# Patient Record
Sex: Female | Born: 1950 | Hispanic: No | Smoking: Never smoker
Health system: Southern US, Community
[De-identification: ages and names within clinical notes are randomized; demographics above are authoritative.]

## PROBLEM LIST (undated history)

## (undated) DIAGNOSIS — G8929 Other chronic pain: Secondary | ICD-10-CM

## (undated) DIAGNOSIS — R51 Headache: Secondary | ICD-10-CM

## (undated) DIAGNOSIS — M25569 Pain in unspecified knee: Secondary | ICD-10-CM

## (undated) DIAGNOSIS — R0602 Shortness of breath: Secondary | ICD-10-CM

## (undated) DIAGNOSIS — R112 Nausea with vomiting, unspecified: Secondary | ICD-10-CM

## (undated) DIAGNOSIS — E039 Hypothyroidism, unspecified: Secondary | ICD-10-CM

## (undated) DIAGNOSIS — Z9889 Other specified postprocedural states: Secondary | ICD-10-CM

## (undated) DIAGNOSIS — M5416 Radiculopathy, lumbar region: Secondary | ICD-10-CM

## (undated) DIAGNOSIS — I10 Essential (primary) hypertension: Secondary | ICD-10-CM

## (undated) DIAGNOSIS — K5900 Constipation, unspecified: Secondary | ICD-10-CM

## (undated) DIAGNOSIS — E7800 Pure hypercholesterolemia, unspecified: Secondary | ICD-10-CM

## (undated) DIAGNOSIS — E119 Type 2 diabetes mellitus without complications: Secondary | ICD-10-CM

## (undated) DIAGNOSIS — M1990 Unspecified osteoarthritis, unspecified site: Secondary | ICD-10-CM

## (undated) HISTORY — PX: APPENDECTOMY: SHX54

---

## 2012-09-28 ENCOUNTER — Encounter (HOSPITAL_COMMUNITY): Payer: Self-pay | Admitting: Emergency Medicine

## 2012-09-28 ENCOUNTER — Emergency Department (INDEPENDENT_AMBULATORY_CARE_PROVIDER_SITE_OTHER)
Admission: EM | Admit: 2012-09-28 | Discharge: 2012-09-28 | Disposition: A | Discharge status: Home / Self Care | Payer: Managed Care, Other (non HMO) | Source: Home / Self Care | Attending: Family Medicine | Admitting: Family Medicine

## 2012-09-28 DIAGNOSIS — I10 Essential (primary) hypertension: Secondary | ICD-10-CM

## 2012-09-28 DIAGNOSIS — E119 Type 2 diabetes mellitus without complications: Secondary | ICD-10-CM

## 2012-09-28 HISTORY — DX: Other chronic pain: G89.29

## 2012-09-28 HISTORY — DX: Pain in unspecified knee: M25.569

## 2012-09-28 HISTORY — DX: Pure hypercholesterolemia, unspecified: E78.00

## 2012-09-28 HISTORY — DX: Essential (primary) hypertension: I10

## 2012-09-28 MED ORDER — LEVOTHYROXINE SODIUM 100 MCG PO TABS: 100.0000 ug | ORAL_TABLET | Freq: Every day | ORAL | Status: DC

## 2012-09-28 MED ORDER — IBUPROFEN 600 MG PO TABS: 600.0000 mg | ORAL_TABLET | Freq: Four times a day (QID) | ORAL | Status: DC | PRN

## 2012-09-28 MED ORDER — INSULIN GLARGINE 100 UNIT/ML SOLOSTAR PEN: PEN_INJECTOR | SUBCUTANEOUS | Status: DC

## 2012-09-28 MED ORDER — SIMVASTATIN 40 MG PO TABS: 40.0000 mg | ORAL_TABLET | Freq: Every evening | ORAL | Status: DC

## 2012-09-28 MED ORDER — LISINOPRIL-HYDROCHLOROTHIAZIDE 20-12.5 MG PO TABS: 1.0000 | ORAL_TABLET | Freq: Every day | ORAL | Status: DC

## 2012-09-28 MED ORDER — GLIMEPIRIDE 2 MG PO TABS: 2.0000 mg | ORAL_TABLET | Freq: Every day | ORAL | Status: DC

## 2012-09-28 MED ORDER — METFORMIN HCL 500 MG PO TABS: 500.0000 mg | ORAL_TABLET | Freq: Two times a day (BID) | ORAL | Status: DC

## 2012-09-28 NOTE — ED Notes (Signed)
Patient has arrived in [place] 2 weeks prior.  Patient here today for medication check and to establish with provider.  Patient is from [place], speaks arabic.  Two adult sons in treatment room.

## 2012-09-28 NOTE — ED Provider Notes (Signed)
CSN: [id number]     Arrival date & time 09/28/12  1645 History   First MD Initiated Contact with Patient 09/28/12 1743     No chief complaint on file.  (Consider location/radiation/quality/duration/timing/severity/associated sxs/prior Treatment) HPI Patient is a 62 yo F who moved to the [place] 2 weeks ago from [place]. She is brought in by her [facility] to establish care and go through her medications. She reports she has DM, HTN and problems with thyroid. She takes medications from [place] currently but does not have refills or large supply of medication. She also reports lower back pain and constipation. She hopes to check on her medication today and to get new prescriptions. She would like extensive blood work today, but explained to patient that she will need a PCP.  Lantus 30 units, Glucophage XR 750mg , Glyburide 5mg , Levothryroxine 300mg , Simvastatin 20, Voltaren 50mg .  [employer]pre[employer] [phone] was used for entire visit.  Past Medical History  Diagnosis Date  . Hypertension   . Diabetes mellitus without complication   . High cholesterol   . Thyroid disease   . Chronic knee pain    Past Surgical History  Procedure Laterality Date  . Abdominal surgery     No family history on file. History  Substance Use Topics  . Smoking status: Never Smoker   . Smokeless tobacco: Not on file  . Alcohol Use: No   OB History   Grav Para Term Preterm Abortions TAB SAB Ect Mult Living                 Review of Systems  Constitutional: Negative for fever and chills.  HENT: Negative for congestion.   Eyes: Negative for visual disturbance.  Respiratory: Negative for cough and shortness of breath.   Cardiovascular: Negative for chest pain and leg swelling.  Gastrointestinal: Positive for constipation. Negative for abdominal pain.  Genitourinary: Negative for dysuria.  Musculoskeletal: Positive for back pain. Negative for myalgias and arthralgias.   Skin: Negative for rash.  Neurological: Negative for headaches.    Allergies  Review of patient's allergies indicates no known allergies.  Home Medications   Current Outpatient Rx  Name  Route  Sig  Dispense  Refill  . aspirin 81 MG tablet   Oral   Take 81 mg by mouth daily.         Marland Kitchen glimepiride (AMARYL) 2 MG tablet   Oral   Take 1 tablet (2 mg total) by mouth daily before breakfast.   30 tablet   0   . ibuprofen (ADVIL,MOTRIN) 600 MG tablet   Oral   Take 1 tablet (600 mg total) by mouth every 6 (six) hours as needed for pain.   30 tablet   0   . Insulin Glargine (LANTUS SOLOSTAR) 100 UNIT/ML SOPN      Take 30 units   15 mL   0     3 ml per pen. 5 pens per box. Please dispense 1 bo ...   . levothyroxine (SYNTHROID, LEVOTHROID) 100 MCG tablet   Oral   Take 1 tablet (100 mcg total) by mouth daily before breakfast.   30 tablet   0   . lisinopril-hydrochlorothiazide (PRINZIDE,ZESTORETIC) 20-12.5 MG per tablet   Oral   Take 1 tablet by mouth daily.   30 tablet   0   . metFORMIN (GLUCOPHAGE) 500 MG tablet   Oral   Take 1 tablet (500 mg total) by mouth 2 (two)  times daily with a meal.   60 tablet   0   . simvastatin (ZOCOR) 40 MG tablet   Oral   Take 1 tablet (40 mg total) by mouth every evening.   30 tablet   0    BP 151/78  Pulse 106  Temp(Src) 98.6 F (37 C) (Oral)  Resp 18  SpO2 98% Physical Exam  Constitutional: She is oriented to person, place, and time. She appears well-developed and well-nourished. No distress.  HENT:  Head: Normocephalic and atraumatic.  Eyes: Pupils are equal, round, and reactive to light.  Neck: Normal range of motion.  Cardiovascular: Normal rate, regular rhythm and normal heart sounds.   Pulmonary/Chest: Effort normal and breath sounds normal. She has no wheezes.  Abdominal: Soft. There is no tenderness.  Musculoskeletal: Normal range of motion. She exhibits no edema and no tenderness.  Lymphadenopathy:    She  has no cervical adenopathy.  Neurological: She is alert and oriented to person, place, and time.  Skin: Skin is warm and dry. No rash noted.    ED Course  Procedures (including critical care time) Labs Review Labs Reviewed - No data to display Imaging Review No results found.  MDM   1. Diabetes   2. HTN (hypertension)    62 yo F presenting to establish care in the [place] and get medications. I have explained to patient and her son that this is an urgent care center and she will need to be seen by a primary care doctor for labs and further adjustments of her medications. However, since she is nearly out of her medications, I will prescribe the [place] equivalent of all of her medications until she can find a doctor. - All medications verified and given Rx for 30 day supply of all the above medications - Given list of local resources, including [facility] and [facility] - If pain worsens or if her health changes prior to establishing care, she should return to [facility] to be evaluated.   Hilarie Fredrickson, MD 09/28/12 2034

## 2012-09-29 NOTE — ED Provider Notes (Signed)
Medical screening examination/treatment/procedure(s) were performed by a resident physician or non-physician practitioner and as the supervising physician I was immediately available for consultation/collaboration.  Clementeen Graham, MD   Rodolph Bong, MD 09/29/12 [phone]

## 2012-10-25 ENCOUNTER — Ambulatory Visit (INDEPENDENT_AMBULATORY_CARE_PROVIDER_SITE_OTHER): Payer: Managed Care, Other (non HMO) | Admitting: Family Medicine

## 2012-10-25 VITALS — BP 120/62 | HR 86 | Temp 98.4°F | Resp 16 | Ht 58.5 in | Wt 207.2 lb

## 2012-10-25 DIAGNOSIS — E1165 Type 2 diabetes mellitus with hyperglycemia: Secondary | ICD-10-CM

## 2012-10-25 DIAGNOSIS — E039 Hypothyroidism, unspecified: Secondary | ICD-10-CM

## 2012-10-25 DIAGNOSIS — E785 Hyperlipidemia, unspecified: Secondary | ICD-10-CM

## 2012-10-25 DIAGNOSIS — R350 Frequency of micturition: Secondary | ICD-10-CM

## 2012-10-25 DIAGNOSIS — E119 Type 2 diabetes mellitus without complications: Secondary | ICD-10-CM

## 2012-10-25 DIAGNOSIS — M545 Low back pain: Secondary | ICD-10-CM

## 2012-10-25 DIAGNOSIS — I10 Essential (primary) hypertension: Secondary | ICD-10-CM

## 2012-10-25 LAB — POCT URINALYSIS DIPSTICK
Bilirubin, UA: NEGATIVE
Blood, UA: NEGATIVE
Glucose, UA: NEGATIVE
Ketones, UA: NEGATIVE
Leukocytes, UA: NEGATIVE
Nitrite, UA: NEGATIVE
Protein, UA: NEGATIVE
Spec Grav, UA: 1.015
Urobilinogen, UA: 0.2
pH, UA: 6

## 2012-10-25 LAB — COMPREHENSIVE METABOLIC PANEL
ALT: 20 U/L (ref 0–35)
AST: 15 U/L (ref 0–37)
Alkaline Phosphatase: 73 U/L (ref 39–117)
CO2: 29 mEq/L (ref 19–32)
Sodium: 139 mEq/L (ref 135–145)
Total Bilirubin: 0.3 mg/dL (ref 0.3–1.2)
Total Protein: 7 g/dL (ref 6.0–8.3)

## 2012-10-25 LAB — POCT UA - MICROSCOPIC ONLY
Bacteria, U Microscopic: NEGATIVE
Casts, Ur, LPF, POC: NEGATIVE
Crystals, Ur, HPF, POC: NEGATIVE
Mucus, UA: NEGATIVE
Yeast, UA: NEGATIVE

## 2012-10-25 LAB — LIPID PANEL
LDL Cholesterol: 80 mg/dL (ref 0–99)
VLDL: 31 mg/dL (ref 0–40)

## 2012-10-25 LAB — POCT GLYCOSYLATED HEMOGLOBIN (HGB A1C): Hemoglobin A1C: 9.3

## 2012-10-25 MED ORDER — INSULIN GLARGINE 100 UNIT/ML SOLOSTAR PEN: PEN_INJECTOR | SUBCUTANEOUS | Status: DC

## 2012-10-25 MED ORDER — LEVOTHYROXINE SODIUM 100 MCG PO TABS: 100.0000 ug | ORAL_TABLET | Freq: Every day | ORAL | Status: DC

## 2012-10-25 MED ORDER — SIMVASTATIN 40 MG PO TABS: 40.0000 mg | ORAL_TABLET | Freq: Every evening | ORAL | Status: DC

## 2012-10-25 MED ORDER — LISINOPRIL-HYDROCHLOROTHIAZIDE 20-12.5 MG PO TABS: 1.0000 | ORAL_TABLET | Freq: Every day | ORAL | Status: DC

## 2012-10-25 MED ORDER — METFORMIN HCL 500 MG PO TABS: 500.0000 mg | ORAL_TABLET | Freq: Two times a day (BID) | ORAL | Status: DC

## 2012-10-25 NOTE — Progress Notes (Addendum)
Subjective:  This chart was scribed for Shade Flood, MD by Leone Payor, ED Scribe. This patient was seen in room Room 8 and the patient's care was started 3:06 PM.   Patient ID: Danielle Ferguson, female    DOB: Jul 01, 1950, 62 y.o.   MRN: [id number]  HPI  Language barrier, son is translating.  HPI Comments: Danielle Ferguson is a 62 y.o. female who presents to [facility] requesting medication refill for DM, HTN, hypothyroidism today. She was last seen at [facility] on 09/28/12 to establish care and medication prescription for DM and HTN. Instructed then for establishing PCP locally, she was given local numbers for PCP along with 30 days medication refill for DM, HTN, and hypothyroidism. No labs noted, just medicines were refilled at that time. Son reports attempting to establish care today but pt scheduled to be established with [facility] family practice for 2-4 weeks. Pt states she has been seen by ortho for her chronic knee pain, but not back pain.   Pt also complains of 1-2 months of lower abdominal pain that wraps around from the low back. She has associated urinary frequency since being diagnosed with DM, but no recent changes in urinary symptoms. She denies known fever, dysuria, chest pain, or shortness of breath. No n/v. At end of visit, asked about constipation treatment   Ongoing, constant, unchanged back pain that radiates to the right leg that has been ongoing for 4 months. She denies weakness to the right lower extremity. She denies loss of bowel or bladder function, anogenital numbness, BLE numbness or weakness. Takes diclofenac BID. Initially stated had not had xray of back or eval, but later in exam/hx, stated that another doctor treated her for her back a month ago - had xray of back, but no specific treatment? Just treated with diclofenac for her knees.   DM2 - on lantus 30 units qd, amaryl 2mg  qd, metformin 500mg  qd.  Home blood sugars 210 -300, 400 and as low as 90 a week ago, but no  symptomatic lows. Denies missed meds.   Difficult history- son translating, but questions repeated multiple times for understanding. Understanding expressed after history and at end of visit after all questions answered.    Past Medical History  Diagnosis Date  . Hypertension   . Diabetes mellitus without complication   . High cholesterol   . Thyroid disease   . Chronic knee pain     Past Surgical History  Procedure Laterality Date  . Abdominal surgery      No Known Allergies  Prior to Admission medications   Medication Sig Start Date End Date Taking? Authorizing Provider  aspirin 81 MG tablet Take 81 mg by mouth daily.   Yes Historical Provider, MD  diclofenac (VOLTAREN) 75 MG EC tablet Take 75 mg by mouth 2 (two) times daily.   Yes Historical Provider, MD  docusate sodium (COLACE) 100 MG capsule Take 100 mg by mouth 2 (two) times daily.   Yes Historical Provider, MD  glimepiride (AMARYL) 2 MG tablet Take 1 tablet (2 mg total) by mouth daily before breakfast. 09/28/12  Yes Amber Nydia Bouton, MD  Insulin Glargine (LANTUS SOLOSTAR) 100 UNIT/ML SOPN Take 30 units 09/28/12  Yes Amber Nydia Bouton, MD  levothyroxine (SYNTHROID, LEVOTHROID) 100 MCG tablet Take 1 tablet (100 mcg total) by mouth daily before breakfast. 09/28/12  Yes Amber Nydia Bouton, MD  lisinopril-hydrochlorothiazide (PRINZIDE,ZESTORETIC) 20-12.5 MG per tablet Take 1 tablet by mouth daily. 09/28/12  Yes Amber M Hairford,  MD  metFORMIN (GLUCOPHAGE) 500 MG tablet Take 1 tablet (500 mg total) by mouth 2 (two) times daily with a meal. 09/28/12  Yes Amber Nydia Bouton, MD  simvastatin (ZOCOR) 40 MG tablet Take 1 tablet (40 mg total) by mouth every evening. 09/28/12  Yes Amber Nydia Bouton, MD  ibuprofen (ADVIL,MOTRIN) 600 MG tablet Take 1 tablet (600 mg total) by mouth every 6 (six) hours as needed for pain. 09/28/12   Amber Nydia Bouton, MD    History   Social History  . Marital Status: Widowed    Spouse Name: N/A    Number of  Children: N/A  . Years of Education: N/A   Occupational History  . Not on file.   Social History Main Topics  . Smoking status: Never Smoker   . Smokeless tobacco: Not on file  . Alcohol Use: No  . Drug Use: No  . Sexual Activity: Not on file   Other Topics Concern  . Not on file   Social History Narrative  . No narrative on file     Review of Systems  Constitutional: Negative for fever.  Respiratory: Negative for shortness of breath.   Cardiovascular: Negative for chest pain.  Gastrointestinal: Positive for abdominal pain (lower).  Genitourinary: Negative for dysuria.  Musculoskeletal: Positive for back pain.  Neurological: Negative for weakness and numbness.       Objective:   Physical Exam  Nursing note and vitals reviewed. Constitutional: She is oriented to person, place, and time. She appears well-developed and well-nourished.  HENT:  Head: Normocephalic and atraumatic.  Eyes: Conjunctivae and EOM are normal. Pupils are equal, round, and reactive to light.  Neck: Normal range of motion. Neck supple. Carotid bruit is not present.  Cardiovascular: Normal rate, regular rhythm, normal heart sounds and intact distal pulses.   Pulmonary/Chest: Effort normal and breath sounds normal.  Abdominal: Soft. She exhibits no distension and no pulsatile midline mass. There is tenderness (slight suprapubic and RLQ/llq. ttp. ). There is no rebound and no guarding.  Musculoskeletal:       Lumbar back: She exhibits normal range of motion, no bony tenderness, no deformity and no spasm.  Slight discomfort to right knee with straight leg raise but not in the back.   No apparent weakness with ambulation, but did not complete heel/toe walk.   Neurological: She is alert and oriented to person, place, and time. She has normal strength. No sensory deficit.  Reflex Scores:      Patellar reflexes are 1+ on the right side and 1+ on the left side.      Achilles reflexes are 1+ on the right  side and 1+ on the left side. Microfilament testing wnl.   Skin: Skin is warm and dry.  Psychiatric: She has a normal mood and affect. Her behavior is normal.   Results for orders placed in visit on 10/25/12  POCT URINALYSIS DIPSTICK      Result Value Range   Color, UA yellow     Clarity, UA clear     Glucose, UA neg     Bilirubin, UA neg     Ketones, UA neg     Spec Grav, UA 1.015     Blood, UA neg     pH, UA 6.0     Protein, UA neg     Urobilinogen, UA 0.2     Nitrite, UA neg     Leukocytes, UA Negative    POCT UA - MICROSCOPIC ONLY  Result Value Range   WBC, Ur, HPF, POC 0-3     RBC, urine, microscopic 0-1     Bacteria, U Microscopic neg     Mucus, UA neg     Epithelial cells, urine per micros 3-6     Crystals, Ur, HPF, POC neg     Casts, Ur, LPF, POC neg     Yeast, UA neg    GLUCOSE, POCT (MANUAL RESULT ENTRY)      Result Value Range   POC Glucose 163 (*) 70 - 99 mg/dl  POCT GLYCOSYLATED HEMOGLOBIN (HGB A1C)      Result Value Range   Hemoglobin A1C 9.3         Assessment & Plan:     Frequent urination - Plan: POCT urinalysis dipstick, POCT UA - Microscopic Only  DM type 2 (diabetes mellitus, type 2) - Plan: metFORMIN (GLUCOPHAGE) 500 MG tablet, Insulin Glargine (LANTUS SOLOSTAR) 100 UNIT/ML SOPN, POCT glucose (manual entry), POCT glycosylated hemoglobin (Hb A1C). Uncontrolled, but reported low of 90, and up to 300-400. Check home cbg's next 1 week as below for more accurate picture of control. Denies missed doses. rtcer precautions.   HTN (hypertension) - Plan: lisinopril-hydrochlorothiazide (PRINZIDE,ZESTORETIC) 20-12.5 MG per tablet. Stable, continue same meds, CMP pending.   Unspecified hypothyroidism - Plan: levothyroxine (SYNTHROID, LEVOTHROID) 100 MCG tabl refilled. , TSH pending.   Low back pain - unsure if she has had XR and eval before as hx changed during ov, but advised to discuss further at follow up.  XR cancelled as possible XR done 1 month  ago.    Other and unspecified hyperlipidemia - Plan: simvastatin (ZOCOR) 40 MG tablet refilled, Comprehensive metabolic panel, Lipid panel pending. abd pain - radiation from back vs constipation as mentioned at end of visit.  incr fiber in diet, fluids and discuss further at follow up.   I personally performed the services described in this documentation, which was scribed in my presence. The recorded information has been reviewed and considered, and addended by me as needed.   Meds ordered this encounter  Medications  . diclofenac (VOLTAREN) 75 MG EC tablet    Sig: Take 75 mg by mouth 2 (two) times daily.  Marland Kitchen docusate sodium (COLACE) 100 MG capsule    Sig: Take 100 mg by mouth 2 (two) times daily.  . simvastatin (ZOCOR) 40 MG tablet    Sig: Take 1 tablet (40 mg total) by mouth every evening.    Dispense:  30 tablet    Refill:  0  . metFORMIN (GLUCOPHAGE) 500 MG tablet    Sig: Take 1 tablet (500 mg total) by mouth 2 (two) times daily with a meal.    Dispense:  60 tablet    Refill:  0  . lisinopril-hydrochlorothiazide (PRINZIDE,ZESTORETIC) 20-12.5 MG per tablet    Sig: Take 1 tablet by mouth daily.    Dispense:  30 tablet    Refill:  0  . levothyroxine (SYNTHROID, LEVOTHROID) 100 MCG tablet    Sig: Take 1 tablet (100 mcg total) by mouth daily before breakfast.    Dispense:  30 tablet    Refill:  0  . Insulin Glargine (LANTUS SOLOSTAR) 100 UNIT/ML SOPN    Sig: Take 30 units    Dispense:  15 mL    Refill:  0    3 ml per pen. 5 pens per box. Please dispense 1 box of pens.   Patient Instructions  Please let me know where she had the  xray of her back and we can talk more about this and her abdominal pain next office visit or this can be followed up at family practice if seen in the next month or two.   Keep a record of your blood sugars 3 times per day and return here or family practice center in next 1 week with these readings to discuss diabetes meds and control as it is  uncontrolled right now. Return to the clinic or go to the nearest emergency room if any of your symptoms worsen or new symptoms occur.  You should receive a call or letter about your lab results within the next week to 10 days.

## 2012-10-25 NOTE — Patient Instructions (Addendum)
Please let me know where she had the xray of her back and we can talk more about this and her abdominal pain next office visit or this can be followed up at family practice if seen in the next month or two.   Keep a record of your blood sugars 3 times per day and return here or family practice center in next 1 week with these readings to discuss diabetes meds and control as it is uncontrolled right now. Return to the clinic or go to the nearest emergency room if any of your symptoms worsen or new symptoms occur.  You should receive a call or letter about your lab results within the next week to 10 days.

## 2012-11-03 ENCOUNTER — Ambulatory Visit (INDEPENDENT_AMBULATORY_CARE_PROVIDER_SITE_OTHER): Payer: Managed Care, Other (non HMO) | Admitting: Family Medicine

## 2012-11-03 VITALS — BP 122/68 | HR 94 | Temp 97.9°F | Resp 18 | Ht <= 58 in | Wt 207.0 lb

## 2012-11-03 DIAGNOSIS — IMO0001 Reserved for inherently not codable concepts without codable children: Secondary | ICD-10-CM

## 2012-11-03 DIAGNOSIS — Z23 Encounter for immunization: Secondary | ICD-10-CM

## 2012-11-03 DIAGNOSIS — K5900 Constipation, unspecified: Secondary | ICD-10-CM

## 2012-11-03 DIAGNOSIS — R109 Unspecified abdominal pain: Secondary | ICD-10-CM

## 2012-11-03 DIAGNOSIS — E039 Hypothyroidism, unspecified: Secondary | ICD-10-CM

## 2012-11-03 DIAGNOSIS — E119 Type 2 diabetes mellitus without complications: Secondary | ICD-10-CM

## 2012-11-03 MED ORDER — METFORMIN HCL 1000 MG PO TABS: 1000.0000 mg | ORAL_TABLET | Freq: Two times a day (BID) | ORAL | Status: DC

## 2012-11-03 NOTE — Patient Instructions (Signed)
1.  INCREASE METFORMIN TO 1000MG  ONE TABLET TWICE DAILY. 2. RETURN IN ONE MONTH 3.  RECORD SUGARS THREE TIMES DAILY ONE WEEK BEFORE NEXT VISIT. 4. INCREASE THYROID MEDICATION TO ONE TABLET ON MONDAYS, WEDNESDAYS, FRIDAYS; TAKE 1/2 TABLET ALL OTHER DAYS OF THE WEEK. 5.  INCREASE INSULIN TO 34 UNITS DAILY.

## 2012-11-03 NOTE — Progress Notes (Signed)
This chart was scribed for Ethelda Chick, MD by Caryn Bee, Medical Scribe. This patient was seen in Room4 and the patient's care was started at 4:05 PM.  Subjective:    Patient ID: Danielle Ferguson, female    DOB: 27-Dec-1950, 62 y.o.   MRN: [id number]  HPI Pt's native language is Arabic. An interpretor was used. HPI Comments: Danielle Ferguson is a 62 y.o. Female with h/o DM who presents to [facility] complaining of lower abdominal pain, urinary incontinence, and constipation. Pt states that the urinary incontinence is bothersome and embarrassing and would like relief. She thinks that the lower abdominal pain may be linked to her constipation.   She gives herself 30 units of lantus once per day and takes 2 pills of metformin daily. Pt is interested in any laboratory studies that may lead to a diagnosis, and she is also interested in an ultrasound of her uterus. Pt has never had a colonoscopy. She requested more information on details of a colonoscopy. She is not interested in a colonoscopy at this time. The pt is more interested in controlling her DM and resolving the constipation. Pt has prescribed thyroid medications that she has been taking regularly; however, pt was suffering from mouth dryness so she decreased her dose to only half a pill. She denies nausea, vomiting, diarrhea. Pt has agreed to get a flu shot during today's visit. Pt moved to the [place] from [place] one month ago.   Review of Systems  Constitutional: Negative for fever, chills, diaphoresis and fatigue.  Gastrointestinal: Positive for abdominal pain and constipation. Negative for nausea, vomiting and diarrhea.  Endocrine: Negative for cold intolerance, heat intolerance, polydipsia, polyphagia and polyuria.  Genitourinary: Positive for urgency and frequency. Negative for dysuria, hematuria, decreased urine volume, vaginal discharge, genital sores, vaginal pain and pelvic pain.  Musculoskeletal: Positive for back  pain.  Neurological: Negative for dizziness, tremors, weakness, light-headedness, numbness and headaches.   Past Medical History  Diagnosis Date  . Hypertension   . Diabetes mellitus without complication   . High cholesterol   . Thyroid disease   . Chronic knee pain    History   Social History  . Marital Status: Widowed    Spouse Name: N/A    Number of Children: N/A  . Years of Education: N/A   Occupational History  . Not on file.   Social History Main Topics  . Smoking status: Never Smoker   . Smokeless tobacco: Not on file  . Alcohol Use: No  . Drug Use: No  . Sexual Activity: Not on file   Other Topics Concern  . Not on file   Social History Narrative  . No narrative on file   Past Surgical History  Procedure Laterality Date  . Abdominal surgery     History reviewed. No pertinent family history. No Known Allergies  History   Social History  . Marital Status: Widowed    Spouse Name: N/A    Number of Children: N/A  . Years of Education: N/A   Occupational History  . Not on file.   Social History Main Topics  . Smoking status: Never Smoker   . Smokeless tobacco: Never Used  . Alcohol Use: No  . Drug Use: No  . Sexual Activity: Not on file   Other Topics Concern  . Not on file   Social History Narrative  . No narrative on file       Objective:   Physical Exam  Nursing note  and vitals reviewed. Constitutional: She is oriented to person, place, and time. She appears well-developed and well-nourished. No distress.  Eyes: Conjunctivae are normal. Pupils are equal, round, and reactive to light.  Neck: Normal range of motion. Neck supple.  Cardiovascular: Normal rate, regular rhythm, normal heart sounds and intact distal pulses.  Exam reveals no gallop and no friction rub.   No murmur heard. Pulmonary/Chest: Effort normal and breath sounds normal. No respiratory distress. She has no wheezes. She has no rales. She exhibits no tenderness.   Abdominal: Soft. Bowel sounds are normal. She exhibits no distension and no mass. There is tenderness. There is no rebound and no guarding.  Tenderness below umbilicus. Tender to palpation below umbilicus.Tenderness to palpation suprapubic region.  Musculoskeletal: Normal range of motion.  Neurological: She is alert and oriented to person, place, and time.  Skin: Skin is warm and dry. She is not diaphoretic.  Skin normal on feet bilaterally. Slight callus on bilateral feet.   Psychiatric: She has a normal mood and affect.       Assessment & Plan:  The primary encounter diagnosis was Type II or unspecified type diabetes mellitus without mention of complication, uncontrolled. Diagnoses of Unspecified hypothyroidism, Unspecified constipation, Abdominal  pain, other specified site, DM type 2 (diabetes mellitus, type 2), Need for prophylactic vaccination and inoculation against influenza, and Needs flu shot were also pertinent to this visit.  1.  DMII: Uncontrolled with HgbA1c of 9.8.  Rx for Metformin 1000mg  bid provided.  Encourage exercise, weight loss, low-sugar and low-carbohydrate food choices. 2.  Hypothyroidism:  Moderately controlled with TSH of 4.5 on 10/25/12.  Repeat in six months.   3.  Constipation: Uncontrolled; recommend Colace bid or Miralax daily; encourage increased water intake and fiber intake.  Warrants colonoscopy but pt declined referral for colonoscopy today. 4.  Abdominal pain lower:  New.  Likely secondary to constipation; treat constipation and reevaluate abdominal pain.  Benign abdominal exam today. 5.  S/p flu vaccine.   Meds ordered this encounter  Medications  . metFORMIN (GLUCOPHAGE) 1000 MG tablet    Sig: Take 1 tablet (1,000 mg total) by mouth 2 (two) times daily with a meal.    Dispense:  60 tablet    Refill:  2   I personally performed the services described in this documentation, which was scribed in my presence.  The recorded information has been reviewed  and is accurate.  Nilda Simmer, M.D.  [facility] [address] [place], [place]  [address] [phone] phone [phone] fax

## 2012-11-27 ENCOUNTER — Other Ambulatory Visit: Payer: Self-pay | Admitting: Family Medicine

## 2012-12-11 ENCOUNTER — Ambulatory Visit (INDEPENDENT_AMBULATORY_CARE_PROVIDER_SITE_OTHER): Payer: Managed Care, Other (non HMO) | Admitting: Family Medicine

## 2012-12-11 VITALS — BP 144/76 | HR 114 | Temp 98.6°F | Resp 18 | Ht 59.0 in | Wt 208.0 lb

## 2012-12-11 DIAGNOSIS — E785 Hyperlipidemia, unspecified: Secondary | ICD-10-CM

## 2012-12-11 DIAGNOSIS — E119 Type 2 diabetes mellitus without complications: Secondary | ICD-10-CM

## 2012-12-11 DIAGNOSIS — M1710 Unilateral primary osteoarthritis, unspecified knee: Secondary | ICD-10-CM

## 2012-12-11 DIAGNOSIS — I10 Essential (primary) hypertension: Secondary | ICD-10-CM

## 2012-12-11 MED ORDER — INSULIN GLARGINE 100 UNIT/ML SOLOSTAR PEN: PEN_INJECTOR | SUBCUTANEOUS | Status: DC

## 2012-12-11 MED ORDER — LISINOPRIL-HYDROCHLOROTHIAZIDE 20-12.5 MG PO TABS: 1.0000 | ORAL_TABLET | Freq: Every day | ORAL | Status: DC

## 2012-12-11 MED ORDER — LEVOTHYROXINE SODIUM 100 MCG PO TABS: ORAL_TABLET | ORAL | Status: DC

## 2012-12-11 MED ORDER — METFORMIN HCL 1000 MG PO TABS: 1000.0000 mg | ORAL_TABLET | Freq: Two times a day (BID) | ORAL | Status: DC

## 2012-12-11 MED ORDER — SIMVASTATIN 40 MG PO TABS: 40.0000 mg | ORAL_TABLET | Freq: Every day | ORAL | Status: DC

## 2012-12-11 MED ORDER — GLIMEPIRIDE 2 MG PO TABS: 2.0000 mg | ORAL_TABLET | Freq: Every day | ORAL | Status: DC

## 2012-12-11 NOTE — Progress Notes (Signed)
Danielle Ferguson is a 62 y.o. female who lives at home w/her family. She is not a smoker.  She has a hx of DM, HTN, hypothyroidism, past CVA and hyperlipidemia. She has been diabetic and with hyperlipidemia for about the past 10 years.   Today, she presents to the [facility] for a surgical clearance of which she is supposed to have knee surgery with Dr. Charma Igo. She states only medical complaints are recently some mild, diffuse headaches. She denies any dizziness with her diabetes and states that she has no lower leg swelling. She is also requesting that I am to be her PCP from now on. She was seen here about 2 months ago and had not yet established a primary care provider but did have blood work performed at that time. She denies any other medical complaints.   Objective:  Results for orders placed in visit on 10/25/12  COMPREHENSIVE METABOLIC PANEL      Result Value Range   Sodium 139  135 - 145 mEq/L   Potassium 4.0  3.5 - 5.3 mEq/L   Chloride 101  96 - 112 mEq/L   CO2 29  19 - 32 mEq/L   Glucose, Bld 165 (*) 70 - 99 mg/dL   BUN 13  6 - 23 mg/dL   Creat 0.98  1.19 - 1.47 mg/dL   Total Bilirubin 0.3  0.3 - 1.2 mg/dL   Alkaline Phosphatase 73  39 - 117 U/L   AST 15  0 - 37 U/L   ALT 20  0 - 35 U/L   Total Protein 7.0  6.0 - 8.3 g/dL   Albumin 4.0  3.5 - 5.2 g/dL   Calcium 9.7  8.4 - 82.9 mg/dL  LIPID PANEL      Result Value Range   Cholesterol 146  0 - 200 mg/dL   Triglycerides 562 (*) <150 mg/dL   HDL 35 (*) >13 mg/dL   Total CHOL/HDL Ratio 4.2     VLDL 31  0 - 40 mg/dL   LDL Cholesterol 80  0 - 99 mg/dL  TSH      Result Value Range   TSH 4.585 (*) 0.350 - 4.500 uIU/mL  POCT URINALYSIS DIPSTICK      Result Value Range   Color, UA yellow     Clarity, UA clear     Glucose, UA neg     Bilirubin, UA neg     Ketones, UA neg     Spec Grav, UA 1.015     Blood, UA neg     pH, UA 6.0     Protein, UA neg     Urobilinogen, UA 0.2     Nitrite, UA neg     Leukocytes, UA Negative     POCT UA - MICROSCOPIC ONLY      Result Value Range   WBC, Ur, HPF, POC 0-3     RBC, urine, microscopic 0-1     Bacteria, U Microscopic neg     Mucus, UA neg     Epithelial cells, urine per micros 3-6     Crystals, Ur, HPF, POC neg     Casts, Ur, LPF, POC neg     Yeast, UA neg    GLUCOSE, POCT (MANUAL RESULT ENTRY)      Result Value Range   POC Glucose 163 (*) 70 - 99 mg/dl  POCT GLYCOSYLATED HEMOGLOBIN (HGB A1C)      Result Value Range   Hemoglobin A1C 9.3  Patient in no acute distress HEENT: Unremarkable with normal fundi Neck: Supple no adenopathy or thyromegaly Chest is clear Heart: Regular no murmur Extremities: No edema. The patient is walking with the use of cane because of her knees.  Assessment: Diabetes not ideally controlled but there is no acute contraindication to going on with the surgery. She's been monitored while she is in the hospital and perhaps see a diabetic specialist in regards to her diet and diabetes management.  Plan: Proceed with surgery and continue current medications until then.  Signed, Elvina Sidle, MD

## 2012-12-27 DIAGNOSIS — M1712 Unilateral primary osteoarthritis, left knee: Secondary | ICD-10-CM | POA: Insufficient documentation

## 2012-12-27 DIAGNOSIS — M1711 Unilateral primary osteoarthritis, right knee: Secondary | ICD-10-CM | POA: Insufficient documentation

## 2012-12-31 ENCOUNTER — Other Ambulatory Visit: Payer: Self-pay | Admitting: Orthopedic Surgery

## 2012-12-31 ENCOUNTER — Encounter (HOSPITAL_COMMUNITY): Payer: Self-pay

## 2012-12-31 ENCOUNTER — Ambulatory Visit (HOSPITAL_COMMUNITY)
Admission: RE | Admit: 2012-12-31 | Discharge: 2012-12-31 | Disposition: A | Discharge status: Home / Self Care | Payer: Managed Care, Other (non HMO) | Source: Ambulatory Visit | Attending: Anesthesiology | Admitting: Anesthesiology

## 2012-12-31 ENCOUNTER — Encounter (HOSPITAL_COMMUNITY)
Admission: RE | Admit: 2012-12-31 | Discharge: 2012-12-31 | Disposition: A | Discharge status: Home / Self Care | Payer: Managed Care, Other (non HMO) | Source: Ambulatory Visit | Attending: Orthopedic Surgery | Admitting: Orthopedic Surgery

## 2012-12-31 DIAGNOSIS — E039 Hypothyroidism, unspecified: Secondary | ICD-10-CM | POA: Insufficient documentation

## 2012-12-31 DIAGNOSIS — E7800 Pure hypercholesterolemia, unspecified: Secondary | ICD-10-CM | POA: Insufficient documentation

## 2012-12-31 DIAGNOSIS — I10 Essential (primary) hypertension: Secondary | ICD-10-CM | POA: Insufficient documentation

## 2012-12-31 DIAGNOSIS — Z0183 Encounter for blood typing: Secondary | ICD-10-CM | POA: Insufficient documentation

## 2012-12-31 DIAGNOSIS — Z01810 Encounter for preprocedural cardiovascular examination: Secondary | ICD-10-CM | POA: Insufficient documentation

## 2012-12-31 DIAGNOSIS — Z794 Long term (current) use of insulin: Secondary | ICD-10-CM | POA: Insufficient documentation

## 2012-12-31 DIAGNOSIS — Z01812 Encounter for preprocedural laboratory examination: Secondary | ICD-10-CM | POA: Insufficient documentation

## 2012-12-31 DIAGNOSIS — E119 Type 2 diabetes mellitus without complications: Secondary | ICD-10-CM | POA: Insufficient documentation

## 2012-12-31 DIAGNOSIS — Z01818 Encounter for other preprocedural examination: Secondary | ICD-10-CM | POA: Insufficient documentation

## 2012-12-31 DIAGNOSIS — M1710 Unilateral primary osteoarthritis, unspecified knee: Secondary | ICD-10-CM | POA: Insufficient documentation

## 2012-12-31 HISTORY — DX: Hypothyroidism, unspecified: E03.9

## 2012-12-31 HISTORY — DX: Shortness of breath: R06.02

## 2012-12-31 HISTORY — DX: Constipation, unspecified: K59.00

## 2012-12-31 HISTORY — DX: Headache: R51

## 2012-12-31 LAB — BASIC METABOLIC PANEL
BUN: 12 mg/dL (ref 6–23)
Calcium: 9.6 mg/dL (ref 8.4–10.5)
Creatinine, Ser: 0.6 mg/dL (ref 0.50–1.10)
GFR calc Af Amer: >90 mL/min (ref >90)
GFR calc non Af Amer: >90 mL/min (ref >90)
Potassium: 4.1 mEq/L (ref 3.5–5.1)

## 2012-12-31 LAB — TYPE AND SCREEN
ABO/RH(D): B POS
Antibody Screen: NEGATIVE

## 2012-12-31 LAB — SURGICAL PCR SCREEN: MRSA, PCR: NEGATIVE

## 2012-12-31 LAB — CBC
Hemoglobin: 14.7 g/dL (ref 12.0–15.0)
MCH: 29.5 pg (ref 26.0–34.0)
MCHC: 35.2 g/dL (ref 30.0–36.0)
RDW: 13.2 % (ref 11.5–15.5)
WBC: 9.8 10*3/uL (ref 4.0–10.5)

## 2012-12-31 NOTE — Progress Notes (Signed)
12/31/12 1216  OBSTRUCTIVE SLEEP APNEA  Score 4 or greater  Results sent to PCP   This patient has screened at risk for sleep apnea using the STOP Bang tool during a pre-surgical visit. A score of 4 or greater is at risk for sleep apnea.

## 2012-12-31 NOTE — Pre-Procedure Instructions (Signed)
Danielle Ferguson  12/31/2012   Your procedure is scheduled on:  Tuesday January 07, 2013 @ 10:36 am.  Report to [facility] Short Stay Entrance "A"  Admitting at 8:30 AM.  Call this number if you have problems the morning of surgery: [phone]   Remember:   Do not eat food or drink liquids after midnight.   Take these medicines the morning of surgery with A SIP OF WATER: Levothyroxine (Synthroid)    Do NOT take any diabetic medications including insulins the morning of your surgery   Do not wear jewelry, make-up or nail polish.  Do not wear lotions, powders, or perfumes. You may wear deodorant.  Do not shave 48 hours prior to surgery.   Do not bring valuables to the hospital.  [facility] is not responsible for any belongings or valuables.               Contacts, dentures or bridgework may not be worn into surgery.  Leave suitcase in the car. After surgery it may be brought to your room.  For patients admitted to the hospital, discharge time is determined by your treatment team.               Patients discharged the day of surgery will not be allowed to drive home.  Name and phone number of your driver: Family/Friend  Special Instructions: Shower using CHG 2 nights before surgery and the night before surgery.  If you shower the day of surgery use CHG.  Use special wash - you have one bottle of CHG for all showers.  You should use approximately 1/3 of the bottle for each shower.   Please read over the following fact sheets that you were given: Pain Booklet, Coughing and Deep Breathing, Blood Transfusion Information, Total Joint Packet, MRSA Information and Surgical Site Infection Prevention

## 2012-12-31 NOTE — Progress Notes (Signed)
Patient unable to speak [place], only Arabic.  Ahmeed (son) at chair side and is translating however Dondra Spry, Diplomatic Services operational officer was notified and interpreter was requested for DOS per primary Nurse. When asked about prior surgeries (per son) patient stated she has not had any. Nurse questioned patient about having any surgery on her stomach and she denied it too, although in EPIC it stated she had a abdominal surgery. Patient denied having any cardiac or pulmonary issues. PCP encounters in EPIC.

## 2013-01-01 NOTE — Progress Notes (Signed)
Anesthesia Chart Review:  Patient is a 62 year old female scheduled for left TKA on 01/07/13 by Dr. Dion Saucier.  History includes morbid obesity, DM2, HTN, hypercholesterolemia, hypothyroidism, headaches, non-smoker, chronic DOE.  PCP is Dr. Elvina Sidle at [facility].  He saw her on 12/11/12 and cleared her for this procedure. He did not think that her A1C results of 9.3 were an acute contraindication for undergoing surgery but recommended close monitory of her DM during her hospitalization with consideration of diabetes specialist consult.  It looks like her Lantus was increased since 10/2012.  EKG on 12/31/12 showed NSR (poor r wave progression in V4-5).  CXR on 12/31/12 showed no active cardiopulmonary disease.  Preoperative labs noted.  Glucose was 313.  (A1C on 10/25/12 was 9.3.) It does not appear that the surgeon ordered a UA.  I have left a voicemail with Sherri at Dr. Shelba Flake office regarding elevated glucose.  I also left a voicemail with patient's son Jayana Kotula to see if he is able to find out how his mother's fasting glucose readings are at home.  He speaks some [place]; however his PAT RN did tell me that she instructed our schedulers to arrange an interpretor to be available on the morning of surgery.    I'll follow-up with patient's son if he returns my call.  Her A1C is consistent with glucose levels ~ 210. She will get a fasting CBG on arrival.  If it is reasonable then I would anticipate that she could proceed as planned.   Velna Ochs [facility] Short Stay Center/Anesthesiology Phone [phone] 01/01/2013 3:21 PM

## 2013-01-06 MED ORDER — CEFAZOLIN SODIUM-DEXTROSE 2-3 GM-% IV SOLR
2.0000 g | INTRAVENOUS | Status: AC
  Administered 2013-01-07: 2 g via INTRAVENOUS
  Filled 2013-01-06: qty 50

## 2013-01-07 ENCOUNTER — Encounter (HOSPITAL_COMMUNITY): Payer: Self-pay | Admitting: *Deleted

## 2013-01-07 ENCOUNTER — Encounter (HOSPITAL_COMMUNITY)
Admission: RE | Disposition: A | Discharge status: Skilled Nursing Facility | Payer: Self-pay | Source: Ambulatory Visit | Attending: Orthopedic Surgery

## 2013-01-07 ENCOUNTER — Inpatient Hospital Stay (HOSPITAL_COMMUNITY): Payer: Managed Care, Other (non HMO) | Admitting: Anesthesiology

## 2013-01-07 ENCOUNTER — Inpatient Hospital Stay (HOSPITAL_COMMUNITY): Payer: Managed Care, Other (non HMO)

## 2013-01-07 ENCOUNTER — Encounter (HOSPITAL_COMMUNITY): Payer: Managed Care, Other (non HMO) | Admitting: Vascular Surgery

## 2013-01-07 ENCOUNTER — Inpatient Hospital Stay (HOSPITAL_COMMUNITY)
Admission: RE | Admit: 2013-01-07 | Discharge: 2013-01-13 | DRG: 470 | Disposition: A | Discharge status: Skilled Nursing Facility | Payer: Managed Care, Other (non HMO) | Source: Ambulatory Visit | Attending: Orthopedic Surgery | Admitting: Orthopedic Surgery

## 2013-01-07 DIAGNOSIS — Z7901 Long term (current) use of anticoagulants: Secondary | ICD-10-CM

## 2013-01-07 DIAGNOSIS — G8929 Other chronic pain: Secondary | ICD-10-CM | POA: Diagnosis present

## 2013-01-07 DIAGNOSIS — Z79899 Other long term (current) drug therapy: Secondary | ICD-10-CM

## 2013-01-07 DIAGNOSIS — Z7982 Long term (current) use of aspirin: Secondary | ICD-10-CM

## 2013-01-07 DIAGNOSIS — I10 Essential (primary) hypertension: Secondary | ICD-10-CM

## 2013-01-07 DIAGNOSIS — E785 Hyperlipidemia, unspecified: Secondary | ICD-10-CM | POA: Diagnosis present

## 2013-01-07 DIAGNOSIS — E7800 Pure hypercholesterolemia, unspecified: Secondary | ICD-10-CM | POA: Diagnosis present

## 2013-01-07 DIAGNOSIS — IMO0001 Reserved for inherently not codable concepts without codable children: Secondary | ICD-10-CM

## 2013-01-07 DIAGNOSIS — M1710 Unilateral primary osteoarthritis, unspecified knee: Principal | ICD-10-CM | POA: Diagnosis present

## 2013-01-07 DIAGNOSIS — Z794 Long term (current) use of insulin: Secondary | ICD-10-CM

## 2013-01-07 DIAGNOSIS — E039 Hypothyroidism, unspecified: Secondary | ICD-10-CM

## 2013-01-07 DIAGNOSIS — M1712 Unilateral primary osteoarthritis, left knee: Secondary | ICD-10-CM

## 2013-01-07 HISTORY — DX: Essential (primary) hypertension: I10

## 2013-01-07 HISTORY — PX: TOTAL KNEE ARTHROPLASTY: SHX125

## 2013-01-07 LAB — GLUCOSE, CAPILLARY
Glucose-Capillary: 148 mg/dL — ABNORMAL HIGH (ref 70–99)
Glucose-Capillary: 175 mg/dL — ABNORMAL HIGH (ref 70–99)
Glucose-Capillary: 296 mg/dL — ABNORMAL HIGH (ref 70–99)

## 2013-01-07 LAB — CBC
HCT: 37.7 % (ref 36.0–46.0)
Hemoglobin: 12.7 g/dL (ref 12.0–15.0)
MCH: 28.4 pg (ref 26.0–34.0)
MCV: 84.3 fL (ref 78.0–100.0)
Platelets: 198 10*3/uL (ref 150–400)
RBC: 4.47 MIL/uL (ref 3.87–5.11)

## 2013-01-07 SURGERY — ARTHROPLASTY, KNEE, TOTAL
Anesthesia: General | Site: Knee | Laterality: Left

## 2013-01-07 MED ORDER — DEXAMETHASONE 6 MG PO TABS
10.0000 mg | ORAL_TABLET | Freq: Three times a day (TID) | ORAL | Status: DC
  Administered 2013-01-08: 10 mg via ORAL
  Filled 2013-01-07 (×4): qty 1

## 2013-01-07 MED ORDER — POTASSIUM CHLORIDE IN NACL 20-0.45 MEQ/L-% IV SOLN
INTRAVENOUS | Status: DC
  Administered 2013-01-07: 18:00:00 via INTRAVENOUS
  Filled 2013-01-07 (×13): qty 1000

## 2013-01-07 MED ORDER — OXYCODONE HCL 5 MG PO TABS
5.0000 mg | ORAL_TABLET | ORAL | Status: DC | PRN
  Administered 2013-01-08 – 2013-01-11 (×13): 10 mg via ORAL
  Filled 2013-01-07 (×14): qty 2

## 2013-01-07 MED ORDER — SIMVASTATIN 40 MG PO TABS
40.0000 mg | ORAL_TABLET | Freq: Every day | ORAL | Status: DC
  Administered 2013-01-07 – 2013-01-12 (×6): 40 mg via ORAL
  Filled 2013-01-07 (×7): qty 1

## 2013-01-07 MED ORDER — ENOXAPARIN SODIUM 30 MG/0.3ML ~~LOC~~ SOLN: 30.0000 mg | Freq: Two times a day (BID) | SUBCUTANEOUS | Status: DC

## 2013-01-07 MED ORDER — SODIUM CHLORIDE 0.9 % IR SOLN
Status: DC | PRN
  Administered 2013-01-07: 1000 mL

## 2013-01-07 MED ORDER — ASPIRIN EC 81 MG PO TBEC
81.0000 mg | DELAYED_RELEASE_TABLET | Freq: Every day | ORAL | Status: DC
  Administered 2013-01-08 – 2013-01-13 (×6): 81 mg via ORAL
  Filled 2013-01-07 (×6): qty 1

## 2013-01-07 MED ORDER — HYDROMORPHONE HCL PF 1 MG/ML IJ SOLN
1.0000 mg | INTRAMUSCULAR | Status: DC | PRN
  Administered 2013-01-07 (×2): 1 mg via INTRAVENOUS
  Filled 2013-01-07 (×2): qty 1

## 2013-01-07 MED ORDER — POLYETHYLENE GLYCOL 3350 17 G PO PACK
17.0000 g | PACK | Freq: Every day | ORAL | Status: DC | PRN
  Administered 2013-01-09: 17 g via ORAL
  Filled 2013-01-07: qty 1

## 2013-01-07 MED ORDER — DIPHENHYDRAMINE HCL 12.5 MG/5ML PO ELIX
12.5000 mg | ORAL_SOLUTION | ORAL | Status: DC | PRN
  Administered 2013-01-11: 25 mg via ORAL
  Filled 2013-01-07: qty 10

## 2013-01-07 MED ORDER — BUPIVACAINE HCL (PF) 0.25 % IJ SOLN
INTRAMUSCULAR | Status: AC
  Filled 2013-01-07: qty 30

## 2013-01-07 MED ORDER — DEXAMETHASONE SODIUM PHOSPHATE 10 MG/ML IJ SOLN
10.0000 mg | Freq: Three times a day (TID) | INTRAMUSCULAR | Status: DC
  Administered 2013-01-07: 10 mg via INTRAVENOUS
  Filled 2013-01-07 (×3): qty 1

## 2013-01-07 MED ORDER — FENTANYL CITRATE 0.05 MG/ML IJ SOLN
INTRAMUSCULAR | Status: AC
  Administered 2013-01-07: 50 ug
  Filled 2013-01-07: qty 2

## 2013-01-07 MED ORDER — ONDANSETRON HCL 4 MG/2ML IJ SOLN: 4.0000 mg | Freq: Four times a day (QID) | INTRAMUSCULAR | Status: DC | PRN

## 2013-01-07 MED ORDER — SUFENTANIL CITRATE 50 MCG/ML IV SOLN
INTRAVENOUS | Status: DC | PRN
  Administered 2013-01-07: 10 ug via INTRAVENOUS
  Administered 2013-01-07 (×2): 5 ug via INTRAVENOUS
  Administered 2013-01-07: 12.5 ug via INTRAVENOUS
  Administered 2013-01-07: 10 ug via INTRAVENOUS
  Administered 2013-01-07: 5 ug via INTRAVENOUS
  Administered 2013-01-07: 2.5 ug via INTRAVENOUS

## 2013-01-07 MED ORDER — PROMETHAZINE HCL 25 MG PO TABS: 25.0000 mg | ORAL_TABLET | Freq: Four times a day (QID) | ORAL | Status: DC | PRN

## 2013-01-07 MED ORDER — LISINOPRIL 20 MG PO TABS
20.0000 mg | ORAL_TABLET | Freq: Every day | ORAL | Status: DC
  Administered 2013-01-07 – 2013-01-13 (×7): 20 mg via ORAL
  Filled 2013-01-07 (×8): qty 1

## 2013-01-07 MED ORDER — DOCUSATE SODIUM 100 MG PO CAPS
100.0000 mg | ORAL_CAPSULE | Freq: Two times a day (BID) | ORAL | Status: DC
  Administered 2013-01-07 – 2013-01-13 (×12): 100 mg via ORAL
  Filled 2013-01-07 (×14): qty 1

## 2013-01-07 MED ORDER — KETOROLAC TROMETHAMINE 15 MG/ML IJ SOLN
7.5000 mg | Freq: Four times a day (QID) | INTRAMUSCULAR | Status: AC
  Administered 2013-01-07: 7.5 mg via INTRAVENOUS
  Filled 2013-01-07 (×2): qty 1

## 2013-01-07 MED ORDER — ASPIRIN 81 MG PO TABS: 81.0000 mg | ORAL_TABLET | Freq: Every day | ORAL | Status: DC

## 2013-01-07 MED ORDER — METOCLOPRAMIDE HCL 5 MG/ML IJ SOLN: 5.0000 mg | Freq: Three times a day (TID) | INTRAMUSCULAR | Status: DC | PRN

## 2013-01-07 MED ORDER — LEVOTHYROXINE SODIUM 100 MCG PO TABS
100.0000 ug | ORAL_TABLET | Freq: Every day | ORAL | Status: DC
  Administered 2013-01-08 – 2013-01-13 (×5): 100 ug via ORAL
  Filled 2013-01-07 (×7): qty 1

## 2013-01-07 MED ORDER — ONDANSETRON HCL 4 MG PO TABS: 4.0000 mg | ORAL_TABLET | Freq: Four times a day (QID) | ORAL | Status: DC | PRN

## 2013-01-07 MED ORDER — CEFAZOLIN SODIUM-DEXTROSE 2-3 GM-% IV SOLR
2.0000 g | Freq: Four times a day (QID) | INTRAVENOUS | Status: AC
  Administered 2013-01-07 (×2): 2 g via INTRAVENOUS
  Filled 2013-01-07 (×2): qty 50

## 2013-01-07 MED ORDER — SUCCINYLCHOLINE CHLORIDE 20 MG/ML IJ SOLN
INTRAMUSCULAR | Status: DC | PRN
  Administered 2013-01-07: 100 mg via INTRAVENOUS

## 2013-01-07 MED ORDER — GLIMEPIRIDE 2 MG PO TABS
2.0000 mg | ORAL_TABLET | Freq: Every day | ORAL | Status: DC
  Administered 2013-01-08 – 2013-01-13 (×6): 2 mg via ORAL
  Filled 2013-01-07 (×7): qty 1

## 2013-01-07 MED ORDER — DOCUSATE SODIUM 100 MG PO CAPS: 100.0000 mg | ORAL_CAPSULE | Freq: Two times a day (BID) | ORAL | Status: DC

## 2013-01-07 MED ORDER — MIDAZOLAM HCL 5 MG/5ML IJ SOLN
INTRAMUSCULAR | Status: DC | PRN
  Administered 2013-01-07: 1 mg via INTRAVENOUS
  Administered 2013-01-07: .5 mg via INTRAVENOUS

## 2013-01-07 MED ORDER — METOCLOPRAMIDE HCL 10 MG PO TABS: 5.0000 mg | ORAL_TABLET | Freq: Three times a day (TID) | ORAL | Status: DC | PRN

## 2013-01-07 MED ORDER — MENTHOL 3 MG MT LOZG
1.0000 | LOZENGE | OROMUCOSAL | Status: DC | PRN
  Administered 2013-01-08: 3 mg via ORAL
  Filled 2013-01-07 (×2): qty 9

## 2013-01-07 MED ORDER — ACETAMINOPHEN 325 MG PO TABS
650.0000 mg | ORAL_TABLET | Freq: Four times a day (QID) | ORAL | Status: DC | PRN
  Administered 2013-01-09: 650 mg via ORAL
  Filled 2013-01-07 (×2): qty 2

## 2013-01-07 MED ORDER — INSULIN ASPART 100 UNIT/ML ~~LOC~~ SOLN
0.0000 [IU] | Freq: Three times a day (TID) | SUBCUTANEOUS | Status: DC
  Administered 2013-01-07: 8 [IU] via SUBCUTANEOUS
  Administered 2013-01-08 (×3): 15 [IU] via SUBCUTANEOUS
  Administered 2013-01-09: 8 [IU] via SUBCUTANEOUS
  Administered 2013-01-09: 3 [IU] via SUBCUTANEOUS
  Administered 2013-01-09: 5 [IU] via SUBCUTANEOUS
  Administered 2013-01-10: 2 [IU] via SUBCUTANEOUS
  Administered 2013-01-10 – 2013-01-11 (×5): 3 [IU] via SUBCUTANEOUS
  Administered 2013-01-12: 8 [IU] via SUBCUTANEOUS
  Administered 2013-01-13: 5 [IU] via SUBCUTANEOUS

## 2013-01-07 MED ORDER — PHENYLEPHRINE HCL 10 MG/ML IJ SOLN
10.0000 mg | INTRAVENOUS | Status: DC | PRN
  Administered 2013-01-07: 80 ug/min via INTRAVENOUS

## 2013-01-07 MED ORDER — SENNA-DOCUSATE SODIUM 8.6-50 MG PO TABS: 2.0000 | ORAL_TABLET | Freq: Every day | ORAL | Status: DC

## 2013-01-07 MED ORDER — BUPIVACAINE HCL (PF) 0.25 % IJ SOLN: INTRAMUSCULAR | Status: DC | PRN

## 2013-01-07 MED ORDER — PROMETHAZINE HCL 25 MG/ML IJ SOLN
6.2500 mg | INTRAMUSCULAR | Status: DC | PRN
  Administered 2013-01-07: 6.25 mg via INTRAVENOUS

## 2013-01-07 MED ORDER — ONDANSETRON HCL 4 MG/2ML IJ SOLN
INTRAMUSCULAR | Status: AC
  Filled 2013-01-07: qty 2

## 2013-01-07 MED ORDER — LABETALOL HCL 5 MG/ML IV SOLN
INTRAVENOUS | Status: DC | PRN
  Administered 2013-01-07 (×2): 5 mg via INTRAVENOUS

## 2013-01-07 MED ORDER — METHOCARBAMOL 100 MG/ML IJ SOLN
500.0000 mg | Freq: Four times a day (QID) | INTRAVENOUS | Status: DC | PRN
  Filled 2013-01-07: qty 5

## 2013-01-07 MED ORDER — HYDROMORPHONE HCL PF 1 MG/ML IJ SOLN
0.2500 mg | INTRAMUSCULAR | Status: DC | PRN
  Administered 2013-01-07 (×2): 0.5 mg via INTRAVENOUS

## 2013-01-07 MED ORDER — MIDAZOLAM HCL 2 MG/2ML IJ SOLN
INTRAMUSCULAR | Status: AC
  Administered 2013-01-07: 1.5 mg
  Filled 2013-01-07: qty 2

## 2013-01-07 MED ORDER — ZOLPIDEM TARTRATE 5 MG PO TABS
5.0000 mg | ORAL_TABLET | Freq: Every evening | ORAL | Status: DC | PRN
  Administered 2013-01-08 – 2013-01-13 (×3): 5 mg via ORAL
  Filled 2013-01-07 (×3): qty 1

## 2013-01-07 MED ORDER — ONDANSETRON HCL 4 MG/2ML IJ SOLN
INTRAMUSCULAR | Status: DC | PRN
  Administered 2013-01-07: 4 mg via INTRAVENOUS

## 2013-01-07 MED ORDER — SENNA 8.6 MG PO TABS
1.0000 | ORAL_TABLET | Freq: Two times a day (BID) | ORAL | Status: DC
  Administered 2013-01-07 – 2013-01-13 (×12): 8.6 mg via ORAL
  Filled 2013-01-07 (×13): qty 1

## 2013-01-07 MED ORDER — HYDROMORPHONE HCL PF 1 MG/ML IJ SOLN
INTRAMUSCULAR | Status: AC
  Filled 2013-01-07: qty 1

## 2013-01-07 MED ORDER — BISACODYL 10 MG RE SUPP
10.0000 mg | Freq: Every day | RECTAL | Status: DC | PRN
  Administered 2013-01-09 – 2013-01-13 (×2): 10 mg via RECTAL
  Filled 2013-01-07 (×2): qty 1

## 2013-01-07 MED ORDER — ENOXAPARIN SODIUM 30 MG/0.3ML ~~LOC~~ SOLN
30.0000 mg | Freq: Two times a day (BID) | SUBCUTANEOUS | Status: DC
  Administered 2013-01-08 – 2013-01-13 (×11): 30 mg via SUBCUTANEOUS
  Filled 2013-01-07 (×14): qty 0.3

## 2013-01-07 MED ORDER — LACTATED RINGERS IV SOLN
INTRAVENOUS | Status: DC | PRN
  Administered 2013-01-07 (×2): via INTRAVENOUS

## 2013-01-07 MED ORDER — MAGNESIUM CITRATE PO SOLN: 1.0000 | Freq: Once | ORAL | Status: AC | PRN

## 2013-01-07 MED ORDER — NEOSTIGMINE METHYLSULFATE 1 MG/ML IJ SOLN
INTRAMUSCULAR | Status: DC | PRN
  Administered 2013-01-07: 3 mg via INTRAVENOUS

## 2013-01-07 MED ORDER — PHENOL 1.4 % MT LIQD
1.0000 | OROMUCOSAL | Status: DC | PRN
  Administered 2013-01-09 – 2013-01-13 (×2): 1 via OROMUCOSAL
  Filled 2013-01-07 (×3): qty 177

## 2013-01-07 MED ORDER — HYDROCHLOROTHIAZIDE 12.5 MG PO CAPS
12.5000 mg | ORAL_CAPSULE | Freq: Every day | ORAL | Status: DC
Start: 2013-01-07 — End: 2013-01-13
  Administered 2013-01-07 – 2013-01-13 (×7): 12.5 mg via ORAL
  Filled 2013-01-07 (×7): qty 1

## 2013-01-07 MED ORDER — PROPOFOL 10 MG/ML IV BOLUS
INTRAVENOUS | Status: DC | PRN
  Administered 2013-01-07: 30 mg via INTRAVENOUS
  Administered 2013-01-07: 170 mg via INTRAVENOUS

## 2013-01-07 MED ORDER — LACTATED RINGERS IV SOLN
INTRAVENOUS | Status: DC
  Administered 2013-01-07: 08:00:00 via INTRAVENOUS

## 2013-01-07 MED ORDER — PROMETHAZINE HCL 25 MG/ML IJ SOLN
INTRAMUSCULAR | Status: AC
  Filled 2013-01-07: qty 1

## 2013-01-07 MED ORDER — METHOCARBAMOL 500 MG PO TABS
500.0000 mg | ORAL_TABLET | Freq: Four times a day (QID) | ORAL | Status: DC | PRN
  Administered 2013-01-09 – 2013-01-13 (×6): 500 mg via ORAL
  Filled 2013-01-07 (×7): qty 1

## 2013-01-07 MED ORDER — METHOCARBAMOL 500 MG PO TABS: 500.0000 mg | ORAL_TABLET | Freq: Four times a day (QID) | ORAL | Status: DC

## 2013-01-07 MED ORDER — ALUM & MAG HYDROXIDE-SIMETH 200-200-20 MG/5ML PO SUSP: 30.0000 mL | ORAL | Status: DC | PRN

## 2013-01-07 MED ORDER — LISINOPRIL-HYDROCHLOROTHIAZIDE 20-12.5 MG PO TABS: 1.0000 | ORAL_TABLET | Freq: Every day | ORAL | Status: DC

## 2013-01-07 MED ORDER — ROCURONIUM BROMIDE 100 MG/10ML IV SOLN
INTRAVENOUS | Status: DC | PRN
  Administered 2013-01-07: 30 mg via INTRAVENOUS

## 2013-01-07 MED ORDER — GLYCOPYRROLATE 0.2 MG/ML IJ SOLN
INTRAMUSCULAR | Status: DC | PRN
  Administered 2013-01-07: .3 mg via INTRAVENOUS

## 2013-01-07 MED ORDER — ACETAMINOPHEN 650 MG RE SUPP: 650.0000 mg | Freq: Four times a day (QID) | RECTAL | Status: DC | PRN

## 2013-01-07 MED ORDER — OXYCODONE-ACETAMINOPHEN 10-325 MG PO TABS: 1.0000 | ORAL_TABLET | Freq: Four times a day (QID) | ORAL | Status: DC | PRN

## 2013-01-07 SURGICAL SUPPLY — 63 items
BANDAGE ELASTIC 4 VELCRO ST LF (GAUZE/BANDAGES/DRESSINGS) ×2 IMPLANT
BANDAGE ELASTIC 6 VELCRO ST LF (GAUZE/BANDAGES/DRESSINGS) ×2 IMPLANT
BANDAGE ESMARK 6X9 LF (GAUZE/BANDAGES/DRESSINGS) ×1 IMPLANT
BENZOIN TINCTURE PRP APPL 2/3 (GAUZE/BANDAGES/DRESSINGS) ×2 IMPLANT
BLADE SAG 18X100X1.27 (BLADE) ×2 IMPLANT
BLADE SAW RECIP 87.9 MT (BLADE) ×2 IMPLANT
BLADE SAW SGTL 13X75X1.27 (BLADE) ×2 IMPLANT
BNDG ESMARK 6X9 LF (GAUZE/BANDAGES/DRESSINGS) ×2
BOOTCOVER CLEANROOM LRG (PROTECTIVE WEAR) ×4 IMPLANT
BOWL SMART MIX CTS (DISPOSABLE) ×2 IMPLANT
CAPT RP KNEE ×2 IMPLANT
CEMENT HV SMART SET (Cement) ×4 IMPLANT
CLOSURE STERI-STRIP 1/4X4 (GAUZE/BANDAGES/DRESSINGS) ×2 IMPLANT
CLOTH BEACON ORANGE TIMEOUT ST (SAFETY) ×2 IMPLANT
CLSR STERI-STRIP ANTIMIC 1/2X4 (GAUZE/BANDAGES/DRESSINGS) ×2 IMPLANT
COVER SURGICAL LIGHT HANDLE (MISCELLANEOUS) ×2 IMPLANT
CUFF TOURNIQUET SINGLE 34IN LL (TOURNIQUET CUFF) ×2 IMPLANT
DRAPE EXTREMITY T 121X128X90 (DRAPE) ×2 IMPLANT
DRAPE PROXIMA HALF (DRAPES) ×2 IMPLANT
DRAPE U-SHAPE 47X51 STRL (DRAPES) ×2 IMPLANT
DRSG PAD ABDOMINAL 8X10 ST (GAUZE/BANDAGES/DRESSINGS) ×2 IMPLANT
DURAPREP 26ML APPLICATOR (WOUND CARE) ×2 IMPLANT
ELECT CAUTERY BLADE 6.4 (BLADE) ×2 IMPLANT
ELECT REM PT RETURN 9FT ADLT (ELECTROSURGICAL) ×2
ELECTRODE REM PT RTRN 9FT ADLT (ELECTROSURGICAL) ×1 IMPLANT
FACESHIELD LNG OPTICON STERILE (SAFETY) ×2 IMPLANT
GLOVE BIOGEL PI ORTHO PRO SZ8 (GLOVE) ×2
GLOVE ORTHO TXT STRL SZ7.5 (GLOVE) ×2 IMPLANT
GLOVE PI ORTHO PRO STRL SZ8 (GLOVE) ×2 IMPLANT
GLOVE SURG ORTHO 8.0 STRL STRW (GLOVE) ×2 IMPLANT
GOWN BRE IMP PREV XXLGXLNG (GOWN DISPOSABLE) ×2 IMPLANT
GOWN STRL REIN XL XLG (GOWN DISPOSABLE) ×2 IMPLANT
HANDPIECE INTERPULSE COAX TIP (DISPOSABLE) ×1
HOOD PEEL AWAY FACE SHEILD DIS (HOOD) ×4 IMPLANT
IMMOBILIZER KNEE 20 (SOFTGOODS) ×2
IMMOBILIZER KNEE 20 THIGH 36 (SOFTGOODS) ×1 IMPLANT
IMMOBILIZER KNEE 22 UNIV (SOFTGOODS) ×2 IMPLANT
KIT BASIN OR (CUSTOM PROCEDURE TRAY) ×2 IMPLANT
KIT ROOM TURNOVER OR (KITS) ×2 IMPLANT
MANIFOLD NEPTUNE II (INSTRUMENTS) ×2 IMPLANT
NS IRRIG 1000ML POUR BTL (IV SOLUTION) ×2 IMPLANT
PACK TOTAL JOINT (CUSTOM PROCEDURE TRAY) ×2 IMPLANT
PAD ARMBOARD 7.5X6 YLW CONV (MISCELLANEOUS) ×4 IMPLANT
PAD CAST 4YDX4 CTTN HI CHSV (CAST SUPPLIES) ×1 IMPLANT
PADDING CAST COTTON 4X4 STRL (CAST SUPPLIES) ×1
PADDING CAST COTTON 6X4 STRL (CAST SUPPLIES) ×2 IMPLANT
SET HNDPC FAN SPRY TIP SCT (DISPOSABLE) ×1 IMPLANT
SPONGE GAUZE 4X4 12PLY (GAUZE/BANDAGES/DRESSINGS) ×2 IMPLANT
STAPLER VISISTAT 35W (STAPLE) ×2 IMPLANT
SUCTION FRAZIER TIP 10 FR DISP (SUCTIONS) ×2 IMPLANT
SUT MNCRL AB 4-0 PS2 18 (SUTURE) ×2 IMPLANT
SUT VIC AB 0 CT1 27 (SUTURE) ×1
SUT VIC AB 0 CT1 27XBRD ANBCTR (SUTURE) ×1 IMPLANT
SUT VIC AB 1 CT1 27 (SUTURE) ×2
SUT VIC AB 1 CT1 27XBRD ANBCTR (SUTURE) ×2 IMPLANT
SUT VIC AB 2-0 CT1 27 (SUTURE) ×1
SUT VIC AB 2-0 CT1 TAPERPNT 27 (SUTURE) ×1 IMPLANT
SUT VIC AB 3-0 SH 8-18 (SUTURE) ×4 IMPLANT
SYR 30ML LL (SYRINGE) ×2 IMPLANT
TOWEL OR 17X24 6PK STRL BLUE (TOWEL DISPOSABLE) ×2 IMPLANT
TOWEL OR 17X26 10 PK STRL BLUE (TOWEL DISPOSABLE) ×2 IMPLANT
TRAY FOLEY CATH 16FRSI W/METER (SET/KITS/TRAYS/PACK) IMPLANT
WATER STERILE IRR 1000ML POUR (IV SOLUTION) ×4 IMPLANT

## 2013-01-07 NOTE — Anesthesia Postprocedure Evaluation (Signed)
  Anesthesia Post-op Note  Patient: Danielle Ferguson  Procedure(s) Performed: Procedure(s): TOTAL KNEE ARTHROPLASTY (Left)  Patient Location: PACU  Anesthesia Type:General  Level of Consciousness: awake  Airway and Oxygen Therapy: Patient Spontanous Breathing  Post-op Pain: mild  Post-op Assessment: Post-op Vital signs reviewed  Post-op Vital Signs: Reviewed  Complications: No apparent anesthesia complications

## 2013-01-07 NOTE — Plan of Care (Signed)
Problem: Consults Goal: Diagnosis- Total Joint Replacement Primary Total Knee left     

## 2013-01-07 NOTE — Progress Notes (Signed)
Orthopedic Tech Progress Note Patient Details:  Danielle Ferguson 1950-06-26 [id number] Put ohf on bed     [name] 01/07/2013, 6:57 PM

## 2013-01-07 NOTE — Anesthesia Preprocedure Evaluation (Addendum)
Anesthesia Evaluation  Patient identified by MRN, date of birth, ID band Patient awake    Reviewed: Allergy & Precautions, H&P , NPO status , Patient's Chart, lab work & pertinent test results  Airway Mallampati: II      Dental   Pulmonary shortness of breath,  breath sounds clear to auscultation        Cardiovascular hypertension, Rhythm:Regular Rate:Normal     Neuro/Psych  Headaches,    GI/Hepatic negative GI ROS, Neg liver ROS,   Endo/Other  diabetesHypothyroidism   Renal/GU negative Renal ROS     Musculoskeletal   Abdominal   Peds  Hematology   Anesthesia Other Findings   Reproductive/Obstetrics                        Anesthesia Physical Anesthesia Plan  ASA: III  Anesthesia Plan: General   Post-op Pain Management:    Induction: Intravenous  Airway Management Planned: Oral ETT  Additional Equipment:   Intra-op Plan:   Post-operative Plan: Extubation in OR  Informed Consent: I have reviewed the patients History and Physical, chart, labs and discussed the procedure including the risks, benefits and alternatives for the proposed anesthesia with the patient or authorized representative who has indicated his/her understanding and acceptance.   Dental advisory given  Plan Discussed with: CRNA, Anesthesiologist and Surgeon  Anesthesia Plan Comments:        Anesthesia Quick Evaluation

## 2013-01-07 NOTE — Op Note (Signed)
DATE OF SURGERY:  01/07/2013 TIME: 12:35 PM  PATIENT NAME:  Danielle Ferguson   AGE: 62 y.o.    PRE-OPERATIVE DIAGNOSIS:  DJD LEFT KNEE  POST-OPERATIVE DIAGNOSIS:  Same  PROCEDURE:  Procedure(s): TOTAL KNEE ARTHROPLASTY   SURGEON:  Eulas Post, MD   ASSISTANT:  Skip Mayer, PA-C, present and scrubbed throughout the case, critical for assistance with exposure, retraction, instrumentation, and closure.   OPERATIVE IMPLANTS: Depuy PFC Sigma, Posterior Stabilized.  Femur size 2, Tibia size 2.5, Patella size 32 3-peg oval button, with a 10 mm polyethylene insert.   PREOPERATIVE INDICATIONS:  Ahlia Mertens is a 62 y.o. year old female with end stage bone on bone degenerative arthritis of the knee who failed conservative treatment, including injections, antiinflammatories, activity modification, and assistive devices, and had significant impairment of their activities of daily living, and elected for Total Knee Arthroplasty.   The risks, benefits, and alternatives were discussed at length including but not limited to the risks of infection, bleeding, nerve injury, stiffness, blood clots, the need for revision surgery, cardiopulmonary complications, among others, and they were willing to proceed.   OPERATIVE DESCRIPTION:  The patient was brought to the operative room and placed in a supine position.  General anesthesia was administered.  IV antibiotics were given.  The lower extremity was prepped and draped in the usual sterile fashion.  Time out was performed.  The leg was elevated and exsanguinated and the tourniquet was inflated.  Anterior quadriceps tendon splitting approach was performed.  The patella was everted and osteophytes were removed.  The anterior horn of the medial and lateral meniscus was removed.   The distal femur was opened with the drill and the intramedullary distal femoral cutting jig was utilized, set at 5 degrees resecting 10 mm off the distal femur.   Care was taken to protect the collateral ligaments.  Then the extramedullary tibial cutting jig was utilized making the appropriate cut using the anterior tibial crest as a reference building in appropriate posterior slope.  Care was taken during the cut to protect the medial and collateral ligaments.  The proximal tibia was removed along with the posterior horns of the menisci.  The PCL was sacrificed.    The extensor gap was measured and was approximately 10mm.    The distal femoral sizing jig was applied, taking care to avoid notching.  The femur was extremely small. Then the 4-in-1 cutting jig was applied and the anterior and posterior femur was cut, along with the chamfer cuts.  All posterior osteophytes were removed.  The flexion gap was then measured and was symmetric with the extension gap.  I completed the distal femoral preparation using the appropriate jig to prepare the box.  The patella was then measured, and cut with the saw.  It measured 23 mm before the cut and then 14 mm after the cut.  The proximal tibia sized and prepared accordingly with the reamer and the punch, and then all components were trialed with the 10mm poly insert.  The knee was found to have excellent balance and full motion.    The above named components were then cemented into place and all excess cement was removed.  Initially, in flexion it appeared that the femur was rocking the tibia component, which was somewhat disconcerting, and I had initially put the real polyethylene and, although it did not seat down normally, so I removed it, and then used a trial polyethylene, and made sure that the cement cured with the  lateral side of the tibial tray completely compressed to the tibia.   Once the cement had cured I removed the trial poly-, and the real polyethylene implant was placed.  The knee was easily taken through a range of motion and the patella tracked well and the knee irrigated copiously and the  parapatellar and subcutaneous tissue closed with vicryl, and monocryl with steri strips for the skin.  The wounds were injected with marcaine, and dressed with sterile gauze and the tourniquet released and the patient was awakened and returned to the PACU in stable and satisfactory condition.  There were no complications.  Total tourniquet time was 115 minutes.

## 2013-01-07 NOTE — Progress Notes (Signed)
Utilization review completed.  

## 2013-01-07 NOTE — H&P (Signed)
PREOPERATIVE H&P  Chief Complaint: DJD LEFT KNEE  HPI: Danielle Ferguson is a 62 y.o. female who presents for preoperative history and physical with a diagnosis of DJD LEFT KNEE. Symptoms are rated as moderate to severe, and have been worsening.  This is significantly impairing activities of daily living.  She has elected for surgical management. She has failed injections, anti-inflammatories, activity modification, bracing, and the use of assistive walking devices.  Past Medical History  Diagnosis Date  . Hypertension   . Diabetes mellitus without complication   . High cholesterol   . Thyroid disease   . Chronic knee pain   . Shortness of breath     with walking  . Headache(784.0)   . Constipation   . Hypothyroidism    Past Surgical History  Procedure Laterality Date  . Abdominal surgery     History   Social History  . Marital Status: Widowed    Spouse Name: N/A    Number of Children: N/A  . Years of Education: N/A   Social History Main Topics  . Smoking status: Never Smoker   . Smokeless tobacco: None  . Alcohol Use: No  . Drug Use: No  . Sexual Activity: None   Other Topics Concern  . None   Social History Narrative  . None   History reviewed. No pertinent family history. No Known Allergies Prior to Admission medications   Medication Sig Start Date End Date Taking? Authorizing Provider  aspirin 81 MG tablet Take 81 mg by mouth daily.   Yes Historical Provider, MD  diclofenac (VOLTAREN) 75 MG EC tablet Take 75 mg by mouth 2 (two) times daily.   Yes Historical Provider, MD  docusate sodium (COLACE) 100 MG capsule Take 100 mg by mouth 2 (two) times daily.   Yes Historical Provider, MD  glimepiride (AMARYL) 2 MG tablet Take 1 tablet (2 mg total) by mouth daily before breakfast. 12/11/12  Yes Elvina Sidle, MD  ibuprofen (ADVIL,MOTRIN) 600 MG tablet Take 1 tablet (600 mg total) by mouth every 6 (six) hours as needed for pain. 09/28/12  Yes Amber Nydia Bouton, MD   Insulin Glargine (LANTUS SOLOSTAR) 100 UNIT/ML SOPN Inject 34 units subcutaneously daily. PATIENT NEEDS OFFICE VISIT FOR ADDITIONAL REFILLS 12/11/12  Yes Elvina Sidle, MD  levothyroxine (SYNTHROID, LEVOTHROID) 100 MCG tablet Take 1 tab by mouth daily on Mon, Wed, Fri, and 1/2 tab other days. PATIENT NEEDS OFFICE VISIT FOR ADDITIONAL REFILLS 12/11/12  Yes Elvina Sidle, MD  lisinopril-hydrochlorothiazide (PRINZIDE,ZESTORETIC) 20-12.5 MG per tablet Take 1 tablet by mouth daily. PATIENT NEEDS OFFICE VISIT FOR ADDITIONAL REFILLS 12/11/12  Yes Elvina Sidle, MD  metFORMIN (GLUCOPHAGE) 1000 MG tablet Take 1 tablet (1,000 mg total) by mouth 2 (two) times daily with a meal. 12/11/12  Yes Elvina Sidle, MD  simvastatin (ZOCOR) 40 MG tablet Take 40 mg by mouth daily.   Yes Historical Provider, MD     Positive ROS: All other systems have been reviewed and were otherwise negative with the exception of those mentioned in the HPI and as above.  Physical Exam: General: Alert, no acute distress Cardiovascular: No pedal edema Respiratory: No cyanosis, no use of accessory musculature GI: No organomegaly, abdomen is soft and non-tender Skin: No lesions in the area of chief complaint Neurologic: Sensation intact distally Psychiatric: Patient is competent for consent with normal mood and affect Lymphatic: No axillary or cervical lymphadenopathy  MUSCULOSKELETAL: Left knee has varus alignment, significant crepitance, range of motion from 5-90.  Assessment: DJD  LEFT KNEE  Plan: Plan for Procedure(s): TOTAL KNEE ARTHROPLASTY  The risks benefits and alternatives were discussed with the patient including but not limited to the risks of nonoperative treatment, versus surgical intervention including infection, bleeding, nerve injury,  blood clots, cardiopulmonary complications, morbidity, mortality, among others, and they were willing to proceed.   Eulas Post, MD Cell [phone]   01/07/2013 10:06 AM

## 2013-01-07 NOTE — Transfer of Care (Signed)
Immediate Anesthesia Transfer of Care Note  Patient: Danielle Ferguson  Procedure(s) Performed: Procedure(s): TOTAL KNEE ARTHROPLASTY (Left)  Patient Location: PACU  Anesthesia Type:General  Level of Consciousness: awake, sedated and patient cooperative  Airway & Oxygen Therapy: Patient Spontanous Breathing and Patient connected to face mask oxygen  Post-op Assessment: Report given to PACU RN, Post -op Vital signs reviewed and stable and Patient moving all extremities  Post vital signs: Reviewed and stable  Complications: No apparent anesthesia complications

## 2013-01-07 NOTE — Preoperative (Signed)
Beta Blockers   Reason not to administer Beta Blockers:Not Applicable 

## 2013-01-07 NOTE — Anesthesia Procedure Notes (Addendum)
Procedure Name: Intubation Date/Time: 01/07/2013 10:38 AM Performed by: Coralee Rud Pre-anesthesia Checklist: Patient identified, Emergency Drugs available, Suction available and Patient being monitored Patient Re-evaluated:Patient Re-evaluated prior to inductionOxygen Delivery Method: Circle system utilized Preoxygenation: Pre-oxygenation with 100% oxygen Intubation Type: IV induction Ventilation: Mask ventilation without difficulty Laryngoscope Size: Miller and 3 Grade View: Grade II Tube type: Oral Tube size: 7.5 mm Number of attempts: 1 Airway Equipment and Method: Stylet Placement Confirmation: ETT inserted through vocal cords under direct vision,  positive ETCO2 and breath sounds checked- equal and bilateral Secured at: 22 cm Tube secured with: Tape Dental Injury: Teeth and Oropharynx as per pre-operative assessment     Anesthesia Regional Block:  Femoral nerve block  Pre-Anesthetic Checklist: ,, timeout performed, Correct Patient, Correct Site, Correct Laterality, Correct Procedure,, site marked, risks and benefits discussed, Surgical consent, Pre-op evaluation,  At surgeon's request  Laterality: Left  Prep: chloraprep       Needles:  Injection technique: Single-shot  Needle Type: Stimulator Needle - 80      Needle Gauge: 22    Additional Needles:  Procedures: ultrasound guided (picture in chart) Femoral nerve block  Nerve Stimulator or Paresthesia:  Response: 0.5 mA,   Additional Responses:   Narrative:  Start time: 01/07/2013 8:30 AM End time: 01/07/2013 8:45 AM Injection made incrementally with aspirations every 5 mL.  Performed by: Personally  Anesthesiologist: Dr. Randa Evens  Additional Notes: A functioning IV was confirmed and monitors were applied.  Sterile prep and drape, hand hygiene and sterile gloves were used.  Negative aspiration prior to incremental administration of local anesthetic using the 22 ga needle. The patient tolerated the  procedure well.    Anesthesia Regional Block:  Femoral nerve blockFemoral nerve block Narrative:  Start time: 01/07/2013 8:30 AM End time: 01/07/2013 8:45 AM

## 2013-01-08 ENCOUNTER — Encounter (HOSPITAL_COMMUNITY): Payer: Self-pay | Admitting: Orthopedic Surgery

## 2013-01-08 DIAGNOSIS — E039 Hypothyroidism, unspecified: Secondary | ICD-10-CM

## 2013-01-08 DIAGNOSIS — IMO0001 Reserved for inherently not codable concepts without codable children: Secondary | ICD-10-CM

## 2013-01-08 DIAGNOSIS — I10 Essential (primary) hypertension: Secondary | ICD-10-CM

## 2013-01-08 LAB — GLUCOSE, CAPILLARY
Glucose-Capillary: 382 mg/dL — ABNORMAL HIGH (ref 70–99)
Glucose-Capillary: 421 mg/dL — ABNORMAL HIGH (ref 70–99)
Glucose-Capillary: 179 mg/dL — ABNORMAL HIGH (ref 70–99)

## 2013-01-08 LAB — CBC
HCT: 35.9 % — ABNORMAL LOW (ref 36.0–46.0)
Hemoglobin: 12.1 g/dL (ref 12.0–15.0)
MCHC: 33.7 g/dL (ref 30.0–36.0)
MCV: 83.9 fL (ref 78.0–100.0)
Platelets: 195 10*3/uL (ref 150–400)
RBC: 4.28 MIL/uL (ref 3.87–5.11)
RDW: 12.9 % (ref 11.5–15.5)
WBC: 10.3 10*3/uL (ref 4.0–10.5)

## 2013-01-08 LAB — BASIC METABOLIC PANEL
BUN: 26 mg/dL — ABNORMAL HIGH (ref 6–23)
CO2: 24 mEq/L (ref 19–32)
Chloride: 97 mEq/L (ref 96–112)
Creatinine, Ser: 0.75 mg/dL (ref 0.50–1.10)
GFR calc Af Amer: >90 mL/min (ref >90)
GFR calc non Af Amer: 90 mL/min — ABNORMAL LOW (ref >90)
Potassium: 4.5 mEq/L (ref 3.5–5.1)

## 2013-01-08 LAB — GLUCOSE, RANDOM: Glucose, Bld: 452 mg/dL — ABNORMAL HIGH (ref 70–99)

## 2013-01-08 MED ORDER — INSULIN GLARGINE 100 UNIT/ML ~~LOC~~ SOLN
20.0000 [IU] | Freq: Every day | SUBCUTANEOUS | Status: DC
  Administered 2013-01-08: 20 [IU] via SUBCUTANEOUS
  Filled 2013-01-08 (×2): qty 0.2

## 2013-01-08 MED ORDER — INSULIN GLARGINE 100 UNIT/ML ~~LOC~~ SOLN
30.0000 [IU] | Freq: Every day | SUBCUTANEOUS | Status: DC
  Administered 2013-01-08 – 2013-01-09 (×2): 30 [IU] via SUBCUTANEOUS
  Filled 2013-01-08 (×2): qty 0.3

## 2013-01-08 NOTE — Progress Notes (Addendum)
Physical Therapy Treatment Patient Details Name: Danielle Ferguson MRN: [id number] DOB: 02-Jun-1950 Today's Date: 01/08/2013 Time: 8295-6213 PT Time Calculation (min): 24 min  PT Assessment / Plan / Recommendation  History of Present Illness Pt is a 62 y/o female admitted s/p L TKA.   PT Comments   Pt required increased assist during ambulation this session. At times, max assist was required to prevent a fall as pt was becoming fatigued and not making corrective changes to posture or technique. The bed was brought up to her at end of ambulation from bathroom, as pt could not have safely made the turn to sit due to fatigue. Pt and family continue to want d/c to home, however pt may benefit more from d/c to SNF for STR prior to returning home, mainly for safety.  Follow Up Recommendations  SNF     Does the patient have the potential to tolerate intense rehabilitation     Barriers to Discharge        Equipment Recommendations  Rolling walker with 5" wheels;3in1 (PT)    Recommendations for Other Services    Frequency 7X/week   Progress towards PT Goals Progress towards PT goals: Progressing toward goals  Plan Discharge plan needs to be updated    Precautions / Restrictions Precautions Precautions: Fall;Knee Required Braces or Orthoses: Knee Immobilizer - Left Knee Immobilizer - Left: Other (comment) (On in bed except when in CPM) Restrictions Weight Bearing Restrictions: Yes LLE Weight Bearing: Weight bearing as tolerated   Pertinent Vitals/Pain 7/10 at rest    Mobility  Bed Mobility Bed Mobility: Sit to Supine;Scooting to HOB Sit to Supine: 3: Mod assist Scooting to HOB: 1: +2 Total assist Scooting to [facility]: Patient Percentage: 40% Details for Bed Mobility Assistance: Pt with mod assist to elevate LE's onto bed, with hand-over-hand assist to grab trapeze due to language barrier.  Transfers Transfers: Sit to Stand;Stand to Sit Sit to Stand: 3: Mod assist;With upper extremity  assist;From bed Stand to Sit: 3: Mod assist;With upper extremity assist;To chair/3-in-1 Details for Transfer Assistance: Tactile cues for hand placement on seated surface. Pt calling for sons to help lift her up out of the chair despite therapist advising against it. Ambulation/Gait Ambulation/Gait Assistance: 1: +2 Total assist Ambulation/Gait: Patient Percentage: 50% - at times, max assist was required Ambulation Distance (Feet): 20 Feet (10 feet into bathroom to void, and 10 feet back to bed.) Assistive device: Rolling walker Ambulation/Gait Assistance Details: Verbal and tactile cueing for sequencing with RW. Pt became agitated, wanting sons to help her, and required increased assist to get back to the bed as her knee began buckling with each step due to fatigue.  Gait Pattern: Step-to pattern;Decreased stride length;Shuffle Gait velocity: Decreased General Gait Details: Pt wants sons on either side of her holding her underneath arms Stairs: No Wheelchair Mobility Wheelchair Mobility: No    Exercises General Exercises - Lower Extremity Ankle Circles/Pumps: 10 reps Quad Sets: 10 reps Hip ABduction/ADduction: 10 reps   PT Diagnosis: Difficulty walking;Acute pain  PT Problem List: Decreased strength;Decreased range of motion;Decreased activity tolerance;Decreased balance;Decreased mobility;Decreased knowledge of use of DME;Decreased safety awareness;Pain PT Treatment Interventions: DME instruction;Stair training;Gait training;Functional mobility training;Therapeutic activities;Therapeutic exercise;Neuromuscular re-education;Patient/family education   PT Goals (current goals can now be found in the care plan section) Acute Rehab PT Goals Patient Stated Goal: To return home PT Goal Formulation: With patient/family Time For Goal Achievement: 01/15/13 Potential to Achieve Goals: Good  Visit Information  Last PT Received On: 01/08/13 Assistance  Needed: +2 History of Present Illness: Pt  is a 62 y/o female admitted s/p L TKA.    Subjective Data  Subjective: Pt asks to walk into the bathroom to void instead of sitting on [facility] because her family was present.  Patient Stated Goal: To return home   Cognition  Cognition Arousal/Alertness: Awake/alert Behavior During Therapy: Anxious Overall Cognitive Status: Within Functional Limits for tasks assessed    Balance  Balance Balance Assessed: No  End of Session PT - End of Session Equipment Utilized During Treatment: Gait belt Activity Tolerance: Patient limited by pain Patient left: in bed;with call bell/phone within reach;with family/visitor present Nurse Communication: Mobility status   GP     Ruthann Cancer 01/08/2013, 3:18 PM  Ruthann Cancer, PT, DPT [phone]

## 2013-01-08 NOTE — Evaluation (Signed)
Physical Therapy Evaluation Patient Details Name: Danielle Ferguson MRN: [id number] DOB: May 23, 1950 Today's Date: 01/08/2013 Time: 8657-8469 PT Time Calculation (min): 36 min  PT Assessment / Plan / Recommendation History of Present Illness  Pt is a 62 y/o female admitted s/p L TKA.  Clinical Impression  This patient presents with acute pain and decreased functional independence following the above mentioned procedure. At the time of PT eval, pt required mod assist to ambulate and wanted her son to help hold her up. Attempted therapeutic exercise and required increased encouragement for participation, as her LE was hurting and she did not want to move it. This patient is appropriate for skilled PT interventions to address functional limitations, improve safety and independence with functional mobility, and return to PLOF.     PT Assessment  Patient needs continued PT services    Follow Up Recommendations  Home health PT    Does the patient have the potential to tolerate intense rehabilitation      Barriers to Discharge        Equipment Recommendations  Rolling walker with 5" wheels;3in1 (PT)    Recommendations for Other Services     Frequency 7X/week    Precautions / Restrictions Precautions Precautions: Fall;Knee Required Braces or Orthoses: Knee Immobilizer - Left Knee Immobilizer - Left: Other (comment) (On in bed except when in CPM) Restrictions Weight Bearing Restrictions: Yes LLE Weight Bearing: Weight bearing as tolerated   Pertinent Vitals/Pain Pt did not rate pain on 0-10 scale, however during weight bearing activity and exercise pt appeared to be in a significant amount of pain. RN notified at end of session.       Mobility  Bed Mobility Bed Mobility: Supine to Sit;Sitting - Scoot to Edge of Bed Supine to Sit: 4: Min assist;With rails;HOB elevated Sitting - Scoot to [place] of Bed: 4: Min assist Sit to Supine: 3: Mod assist Scooting to HOB: 1: +2 Total  assist Scooting to [facility]: Patient Percentage: 40% Details for Bed Mobility Assistance: Pt with mod assist to elevate LE's onto bed, with hand-over-hand assist to grab trapeze due to language barrier.  Transfers Transfers: Sit to Stand;Stand to Sit Sit to Stand: 3: Mod assist;With upper extremity assist;From bed Stand to Sit: 3: Mod assist;With upper extremity assist;To chair/3-in-1 Details for Transfer Assistance: VC's for hand placement on seated surface Ambulation/Gait Ambulation/Gait Assistance: 3: Mod assist Ambulation Distance (Feet): 5 Feet Assistive device: Rolling walker Ambulation/Gait Assistance Details: VC's for improved posture and sequencing with the RW. Mod assist for trunk support and off-weighting pt during step-through.  Gait Pattern: Step-to pattern;Decreased stride length;Shuffle Gait velocity: Decreased    Exercises General Exercises - Lower Extremity Ankle Circles/Pumps: 10 reps Quad Sets: 10 reps Hip ABduction/ADduction: 10 reps   PT Diagnosis: Difficulty walking;Acute pain  PT Problem List: Decreased strength;Decreased range of motion;Decreased activity tolerance;Decreased balance;Decreased mobility;Decreased knowledge of use of DME;Decreased safety awareness;Pain PT Treatment Interventions: DME instruction;Stair training;Gait training;Functional mobility training;Therapeutic activities;Therapeutic exercise;Neuromuscular re-education;Patient/family education     PT Goals(Current goals can be found in the care plan section) Acute Rehab PT Goals Patient Stated Goal: To return home PT Goal Formulation: With patient/family Time For Goal Achievement: 01/15/13 Potential to Achieve Goals: Good  Visit Information  Last PT Received On: 01/08/13 Assistance Needed: +2 (For safety) PT/OT/SLP Co-Evaluation/Treatment: Yes Reason for Co-Treatment: For patient/therapist safety PT goals addressed during session: Mobility/safety with mobility;Balance;Proper use of  DME;Strengthening/ROM OT goals addressed during session: ADL's and self-care;Proper use of Adaptive equipment and DME History of  Present Illness: Pt is a 62 y/o female admitted s/p L TKA.       Prior Functioning  Home Living Family/patient expects to be discharged to:: Private residence Living Arrangements: Children Available Help at Discharge: Family;Available 24 hours/day Type of Home: House Home Access: Stairs to enter [employer] of Steps: 3-5 Entrance Stairs-Rails: None Home Layout: Two level;1/2 bath on main level;Able to live on main level with bedroom/bathroom Home Equipment: None Prior Function Level of Independence: Independent Communication Communication: No difficulties;Prefers language other than English (Arabic - son (caregiver) translated) Dominant Hand: Right    Cognition  Cognition Arousal/Alertness: Awake/alert Behavior During Therapy: Anxious (fearful of pain and falling) Overall Cognitive Status: Within Functional Limits for tasks assessed    Extremity/Trunk Assessment Upper Extremity Assessment Upper Extremity Assessment: Defer to OT evaluation Lower Extremity Assessment Lower Extremity Assessment: LLE deficits/detail LLE Deficits / Details: Decreased strength and AROM consistent with TKA LLE: Unable to fully assess due to pain Cervical / Trunk Assessment Cervical / Trunk Assessment: Kyphotic   Balance Balance Balance Assessed: Yes Static Sitting Balance Static Sitting - Balance Support: Feet supported;Bilateral upper extremity supported Static Sitting - Level of Assistance: 5: Stand by assistance Static Standing Balance Static Standing - Balance Support: Bilateral upper extremity supported Static Standing - Level of Assistance: 4: Min assist  End of Session PT - End of Session Equipment Utilized During Treatment: Gait belt Activity Tolerance: Patient limited by pain Patient left: in chair;with call bell/phone within reach;with  family/visitor present Nurse Communication: Mobility status  GP     Ruthann Cancer 01/08/2013, 2:59 PM  Ruthann Cancer, PT, DPT [phone]

## 2013-01-08 NOTE — Progress Notes (Signed)
     Subjective:  Patient reports pain as moderate.  Appreciate medical input regarding hyperglycemia. She lost her IV and does not want replaced.  Objective:   VITALS:   Filed Vitals:   01/07/13 2048 01/07/13 2050 01/08/13 0145 01/08/13 0450  BP: 146/92 146/92 129/63 132/62  Pulse:  109 104 108  Temp:  98.7 F (37.1 C) 98.4 F (36.9 C) 98.6 F (37 C)  TempSrc:      Resp:  16 16 20   Height:      Weight:      SpO2:  94% 92% 100%    Dorsiflexion/Plantar flexion intact Incision: dressing C/D/I   Lab Results  Component Value Date   WBC 10.3 01/08/2013   HGB 12.1 01/08/2013   HCT 35.9* 01/08/2013   MCV 83.9 01/08/2013   PLT 195 01/08/2013     Assessment/Plan: 1 Day Post-Op   Active Problems:   Type II or unspecified type diabetes mellitus without mention of complication, uncontrolled   Unspecified hypothyroidism   Knee osteoarthritis   Advance diet Up with therapy D/C IV fluids Discharge home with home health versus skilled nursing facility, depending on functional capacity, which we will determine with physical therapy.   Khameron Gruenwald P 01/08/2013, 9:07 AM   Teryl Lucy, MD Cell [phone]

## 2013-01-08 NOTE — Care Management Note (Signed)
CARE MANAGEMENT NOTE 01/08/2013  Patient:  Danielle Ferguson, Danielle Ferguson   Account Number:  [id number]  Date Initiated:  01/08/2013  Documentation initiated by:  Vance Peper  Subjective/Objective Assessment:   62yr old female s/p lleft total knee arthroplasty.     Action/Plan:   CM spoke with patient's son concerning home health and DME needs. Family will assist at home, patient lives with her son's. CM notified Jodene Nam with [facility]. CM also waiting for PT eval.   Anticipated DC Date:  01/09/2013   Anticipated DC Plan:  HOME W HOME HEALTH SERVICES      DC Planning Services  CM consult      [facility]  HOME HEALTH  DURABLE MEDICAL EQUIPMENT   Choice offered to / List presented to:  C-4 Adult Children   DME arranged  3-N-1  [facility]      DME agency  [facility]     HH arranged  HH-2 PT      [facility] agency  [facility]   Status of service:  In process, will continue to follow Medicare Important Message given?   (If response is "NO", the following Medicare IM given date fields will be blank) Date Medicare IM given:   Date Additional Medicare IM given:    Discharge Disposition:    Per UR Regulation:

## 2013-01-08 NOTE — Consult Note (Addendum)
[facility] Medical Consultation  Chrysta Fulcher ZOX:[id number] DOB: 10-09-1950 DOA: 01/07/2013 PCP: No PCP Per Patient   Requesting physician:  Date of consultation: 01/08/13 Reason for consultation: uncontrolled DM  Impression/Recommendations Active Problems:   Type II or unspecified type diabetes mellitus without mention of complication, uncontrolled   Unspecified hypothyroidism   Knee osteoarthritis   62 y/o female with PMH of IDDM, HTN, DJD underwent L total knee replacement 01/07/13; hospital ist was called for uncontrolled MD post op; she denies polyuria, polydipsia, no chest pain, no SOB, no fever, no nausea, vomiting or diarrhea  -patient was taking lantus 30 AM,. 20 PM which was put on hold periop and she is started dexa 10 mg bid likely resulted in hyperglycemia   1. IDDM with hyperglycemia likely due to holding lantus, +steroids; -bicard 24; resume lantus 30 AM, 20 PM +ISS; cont po meds; d/c steroids; outpatient titration of insulin as needed   2. HTN  Resume home meds;   I will follow up if she still remains in the hospital. Please contact me if I can be of assistance in the meanwhile. Thank you for this consultation.  Chief Complaint: hyperglycemia   HPI:  62 y/o female with PMH of IDDM, HTN, DJD underwent L total knee replacement 01/07/13; hospital ist was called for uncontrolled MD post op; she denies polyuria, polydipsia, no chest pain, no SOB, no fever, no nausea, vomiting or diarrhea  -patient was taking lantus 30 AM,. 20 PM which was put on hold periop and she is started dexa 10 mg bid likely resulted in hyperglycemia   Review of Systems:  Review of Systems  Constitutional: Negative for fever, chills, malaise/fatigue and diaphoresis.  HENT: Negative for ear discharge, ear pain, hearing loss and tinnitus.   Eyes: Negative for blurred vision, double vision and photophobia.  Respiratory: Negative for cough, hemoptysis, sputum production, shortness of  breath, wheezing and stridor.   Cardiovascular: Negative for chest pain, palpitations, orthopnea and claudication.  Gastrointestinal: Negative for heartburn, nausea, vomiting, abdominal pain and diarrhea.  Genitourinary: Negative for dysuria and urgency.  Musculoskeletal: Positive for back pain and joint pain.  Skin: Negative for itching and rash.  Neurological: Negative for dizziness, tingling, sensory change, speech change, weakness and headaches.  Endo/Heme/Allergies: Does not bruise/bleed easily.  Psychiatric/Behavioral: Negative for depression, suicidal ideas and substance abuse.     Past Medical History  Diagnosis Date  . Hypertension   . Diabetes mellitus without complication   . High cholesterol   . Thyroid disease   . Chronic knee pain   . Shortness of breath     with walking  . Headache(784.0)   . Constipation   . Hypothyroidism    Past Surgical History  Procedure Laterality Date  . Abdominal surgery     Social History:  reports that she has never smoked. She does not have any smokeless tobacco history on file. She reports that she does not drink alcohol or use illicit drugs.  No Known Allergies History reviewed. No pertinent family history.  Prior to Admission medications   Medication Sig Start Date End Date Taking? Authorizing Provider  aspirin 81 MG tablet Take 81 mg by mouth daily.   Yes Historical Provider, MD  diclofenac (VOLTAREN) 75 MG EC tablet Take 75 mg by mouth 2 (two) times daily.   Yes Historical Provider, MD  docusate sodium (COLACE) 100 MG capsule Take 100 mg by mouth 2 (two) times daily.   Yes Historical Provider, MD  glimepiride (AMARYL) 2  MG tablet Take 1 tablet (2 mg total) by mouth daily before breakfast. 12/11/12  Yes Elvina Sidle, MD  ibuprofen (ADVIL,MOTRIN) 600 MG tablet Take 1 tablet (600 mg total) by mouth every 6 (six) hours as needed for pain. 09/28/12  Yes Amber Nydia Bouton, MD  Insulin Glargine (LANTUS SOLOSTAR) 100 UNIT/ML SOPN  Inject 34 units subcutaneously daily. PATIENT NEEDS OFFICE VISIT FOR ADDITIONAL REFILLS 12/11/12  Yes Elvina Sidle, MD  levothyroxine (SYNTHROID, LEVOTHROID) 100 MCG tablet Take 1 tab by mouth daily on Mon, Wed, Fri, and 1/2 tab other days. PATIENT NEEDS OFFICE VISIT FOR ADDITIONAL REFILLS 12/11/12  Yes Elvina Sidle, MD  lisinopril-hydrochlorothiazide (PRINZIDE,ZESTORETIC) 20-12.5 MG per tablet Take 1 tablet by mouth daily. PATIENT NEEDS OFFICE VISIT FOR ADDITIONAL REFILLS 12/11/12  Yes Elvina Sidle, MD  metFORMIN (GLUCOPHAGE) 1000 MG tablet Take 1 tablet (1,000 mg total) by mouth 2 (two) times daily with a meal. 12/11/12  Yes Elvina Sidle, MD  simvastatin (ZOCOR) 40 MG tablet Take 40 mg by mouth daily.   Yes Historical Provider, MD  enoxaparin (LOVENOX) 30 MG/0.3ML injection Inject 0.3 mLs (30 mg total) into the skin every 12 (twelve) hours. 01/07/13   Eulas Post, MD  methocarbamol (ROBAXIN) 500 MG tablet Take 1 tablet (500 mg total) by mouth 4 (four) times daily. 01/07/13   Eulas Post, MD  oxyCODONE-acetaminophen (PERCOCET) 10-325 MG per tablet Take 1-2 tablets by mouth every 6 (six) hours as needed for pain. MAXIMUM TOTAL ACETAMINOPHEN DOSE IS 4000 MG PER DAY 01/07/13   Eulas Post, MD  promethazine (PHENERGAN) 25 MG tablet Take 1 tablet (25 mg total) by mouth every 6 (six) hours as needed for nausea or vomiting. 01/07/13   Eulas Post, MD  sennosides-docusate sodium (SENOKOT-S) 8.6-50 MG tablet Take 2 tablets by mouth daily. 01/07/13   Eulas Post, MD   Physical Exam: Blood pressure 132/62, pulse 108, temperature 98.6 F (37 C), temperature source Oral, resp. rate 20, height 4' 10.66" (1.49 m), weight 94.5 kg (208 lb 5.4 oz), SpO2 100.00%. Filed Vitals:   01/08/13 0450  BP: 132/62  Pulse: 108  Temp: 98.6 F (37 C)  Resp: 20     General:  aletr  Eyes: eom-i, perrla   ENT: no oral ul;cers   Neck: supple   Cardiovascular: s1,s2 rrr  Respiratory:  CTA BL  Abdomen: soft, obese, nt  Skin: no rash  Musculoskeletal: LE post op dressed   Psychiatric: no hallucinations   Neurologic: CN 2-*12 intact   Labs on Admission:  Basic Metabolic Panel:  Recent Labs Lab 01/07/13 1835 01/08/13 0427  NA  --  132*  K  --  4.5  CL  --  97  CO2  --  24  GLUCOSE  --  446*  BUN  --  26*  CREATININE 0.61 0.75  CALCIUM  --  8.5   Liver Function Tests: No results found for this basename: AST, ALT, ALKPHOS, BILITOT, PROT, ALBUMIN,  in the last 168 hours No results found for this basename: LIPASE, AMYLASE,  in the last 168 hours No results found for this basename: AMMONIA,  in the last 168 hours CBC:  Recent Labs Lab 01/07/13 1835 01/08/13 0427  WBC 12.7* 10.3  HGB 12.7 12.1  HCT 37.7 35.9*  MCV 84.3 83.9  PLT 198 195   Cardiac Enzymes: No results found for this basename: CKTOTAL, CKMB, CKMBINDEX, TROPONINI,  in the last 168 hours BNP: No components found with this basename:  POCBNP,  CBG:  Recent Labs Lab 01/07/13 0950 01/07/13 1324 01/07/13 1809 01/07/13 2125 01/08/13 0635  GLUCAP 175* 191* 277* 296* 403*    Radiological Exams on Admission: Dg Knee Left Port  01/07/2013   CLINICAL DATA:  Left knee replacement.  EXAM: PORTABLE LEFT KNEE - 1-2 VIEW  COMPARISON:  None.  FINDINGS: Patient status post total left knee replacement with good anatomic alignment. No acute abnormalities identified. Soft tissue air noted consistent with surgery.  IMPRESSION: Total left knee replacement with good anatomic alignment.   Electronically Signed   By: Maisie Fus  Register   On: 01/07/2013 14:56    EKG: Independently reviewed.  Not doe  Time spent: >35 mionutes   Esperanza Sheets [facility] Pager [phone]  If 7PM-7AM, please contact night-coverage [email or web address] Password [facility] 01/08/2013, 8:43 AM

## 2013-01-08 NOTE — Evaluation (Signed)
Occupational Therapy Evaluation Patient Details Name: Danielle Ferguson MRN: [id number] DOB: [birth year] Today's Date: 01/08/2013 Time: 1000-1025 OT Time Calculation (min): 25 min  OT Assessment / Plan / Recommendation History of present illness LTKA   Clinical Impression   [name] admitted and underwent above presents to acute OT with decreased AROM LLE, increased pain LLE, anxiety, obesity, language barrier--all affecting pt's PLOF at Independent per son. Will    OT Assessment  Patient needs continued OT Services    Follow Up Recommendations  Home health OT       Equipment Recommendations  3 in 1 bedside comode (tub/shower DME TBD)       Frequency  Min 2X/week    Precautions / Restrictions Precautions Precautions: Fall;Knee Required Braces or Orthoses: Knee Immobilizer - Left Knee Immobilizer - Left:  (On in bed except when in CPM) Restrictions LLE Weight Bearing: Weight bearing as tolerated   Pertinent Vitals/Pain 6/10 FACES; repositioned    ADL  Eating/Feeding: Independent Where Assessed - Eating/Feeding: Chair Grooming: Set up Where Assessed - Grooming: Supported sitting Upper Body Bathing: Set up Where Assessed - Upper Body Bathing: Supported sitting Lower Body Bathing: Maximal assistance Where Assessed - Lower Body Bathing: Supported sit to stand Upper Body Dressing: Minimal assistance Where Assessed - Upper Body Dressing: Supported sitting Lower Body Dressing: +1 Total assistance Where Assessed - Lower Body Dressing: Supported sit to Pharmacist, hospital: Moderate assistance Toilet Transfer Method: Sit to Barista: Bedside commode Toileting - Clothing Manipulation and Hygiene: +1 Total assistance Where Assessed - Toileting Clothing Manipulation and Hygiene: Sit to stand from 3-in-1 or toilet Equipment Used: Gait belt;Rolling walker;Knee Immobilizer Transfers/Ambulation Related to ADLs: Mod A sit<>stand and ambulation at  times    OT Diagnosis: Generalized weakness;Acute pain  OT Problem List: Decreased strength;Decreased range of motion;Decreased activity tolerance;Impaired balance (sitting and/or standing);Pain;Obesity;Decreased knowledge of use of DME or AE OT Treatment Interventions: Self-care/ADL training;DME and/or AE instruction;Patient/family education;Balance training   OT Goals(Current goals can be found in the care plan section) Acute Rehab OT Goals OT Goal Formulation: With patient/family Time For Goal Achievement: 01/15/13 Potential to Achieve Goals: Good  Visit Information  Last OT Received On: 01/08/13 Assistance Needed: +2 (for safety) PT/OT/SLP Co-Evaluation/Treatment: Yes (partial) Reason for Co-Treatment: For patient/therapist safety OT goals addressed during session: ADL's and self-care;Proper use of Adaptive equipment and DME History of Present Illness: LTKA       Prior Functioning     Home Living Family/patient expects to be discharged to:: Private residence Living Arrangements: Children Available Help at Discharge: Family;Available 24 hours/day Type of Home: House Home Access: Stairs to enter Entrance Stairs-Rails: None Home Layout: Two level;1/2 bath on main level;Able to live on main level with bedroom/bathroom Home Equipment: None Prior Function Level of Independence: Independent Communication Communication: No difficulties;Prefers language other than English (Arabic, son (caregiver) translated) Dominant Hand: Right         Vision/Perception Vision - History Patient Visual Report: No change from baseline   Cognition  Cognition Arousal/Alertness: Awake/alert Behavior During Therapy: Anxious (fearful of pain and falling) Overall Cognitive Status: Within Functional Limits for tasks assessed    Extremity/Trunk Assessment Upper Extremity Assessment Upper Extremity Assessment: Overall WFL for tasks assessed     Mobility Bed Mobility Bed Mobility: Supine to  Sit;Sitting - Scoot to Edge of Bed Supine to Sit: 4: Min assist;With rails;HOB elevated (for LLE) Transfers Transfers: Sit to Stand;Stand to Sit Sit to Stand: 3: Mod assist;With upper  extremity assist;From bed Stand to Sit: 3: Mod assist;With upper extremity assist;To chair/3-in-1 Details for Transfer Assistance: VCs for safe hand placement           End of Session OT - End of Session Equipment Utilized During Treatment: Gait belt;Rolling walker;Left knee immobilizer Activity Tolerance: Patient limited by fatigue;Patient limited by pain Patient left: in chair (with PT doing exercises) Nurse Communication: Mobility status       Danielle Ferguson [phone] 01/08/2013, 11:46 AM

## 2013-01-09 DIAGNOSIS — M1710 Unilateral primary osteoarthritis, unspecified knee: Secondary | ICD-10-CM

## 2013-01-09 DIAGNOSIS — IMO0002 Reserved for concepts with insufficient information to code with codable children: Secondary | ICD-10-CM

## 2013-01-09 LAB — CBC
MCV: 83.9 fL (ref 78.0–100.0)
Platelets: 198 10*3/uL (ref 150–400)
RDW: 13 % (ref 11.5–15.5)
WBC: 12.4 10*3/uL — ABNORMAL HIGH (ref 4.0–10.5)

## 2013-01-09 LAB — GLUCOSE, CAPILLARY
Glucose-Capillary: 190 mg/dL — ABNORMAL HIGH (ref 70–99)
Glucose-Capillary: 204 mg/dL — ABNORMAL HIGH (ref 70–99)
Glucose-Capillary: 187 mg/dL — ABNORMAL HIGH (ref 70–99)

## 2013-01-09 MED ORDER — INSULIN GLARGINE 100 UNIT/ML ~~LOC~~ SOLN
24.0000 [IU] | Freq: Every day | SUBCUTANEOUS | Status: DC
  Administered 2013-01-09 – 2013-01-10 (×2): 24 [IU] via SUBCUTANEOUS
  Filled 2013-01-09 (×3): qty 0.24

## 2013-01-09 MED ORDER — INSULIN GLARGINE 100 UNIT/ML ~~LOC~~ SOLN
34.0000 [IU] | Freq: Every day | SUBCUTANEOUS | Status: DC
  Administered 2013-01-10 – 2013-01-11 (×2): 34 [IU] via SUBCUTANEOUS
  Filled 2013-01-09 (×2): qty 0.34

## 2013-01-09 NOTE — Progress Notes (Signed)
Physical Therapy Treatment Patient Details Name: Danielle Ferguson MRN: [id number] DOB: 04/19/1950 Today's Date: 01/09/2013 Time: 1027-2536 PT Time Calculation (min): 27 min  PT Assessment / Plan / Recommendation  History of Present Illness Pt is a 62 y/o female admitted s/p L TKA.   PT Comments   Pt slightly improved with mobility, although walking still very limited (max of 15 ft) due to dizziness. If progress continues to be slow, may need SNF on d/c.   Follow Up Recommendations  SNF (depending on progress; pt/family want to go home)     Does the patient have the potential to tolerate intense rehabilitation     Barriers to Discharge        Equipment Recommendations  Rolling walker with 5" wheels;3in1 (PT)    Recommendations for Other Services    Frequency 7X/week   Progress towards PT Goals Progress towards PT goals: Progressing toward goals  Plan Discharge plan needs to be updated    Precautions / Restrictions Precautions Precautions: Fall;Knee Required Braces or Orthoses: Knee Immobilizer - Left Knee Immobilizer - Left: Other (comment) (On in bed except when in CPM) Restrictions Weight Bearing Restrictions: Yes LLE Weight Bearing: Weight bearing as tolerated   Pertinent Vitals/Pain Via interpreter, pt reports "her nerves in her leg feel tight" with pt indicating behind her knee and posterior thigh; patient repositioned for comfort; RN notified ? Need for pain medicine    Mobility  Bed Mobility Bed Mobility: Supine to Sit;Sitting - Scoot to Edge of Bed Supine to Sit: 3: Mod assist;HOB flat Sitting - Scoot to [place] of Bed: 4: Min assist Details for Bed Mobility Assistance: Pt requires assist to move LLE and to raise torso Transfers Transfers: Sit to Stand;Stand to Sit Sit to Stand: 3: Mod assist;With upper extremity assist;From bed Stand to Sit: With upper extremity assist;To chair/3-in-1;4: Min assist Details for Transfer Assistance: tactile cues for hand  placement; repeated x 2 after seated rest Ambulation/Gait Ambulation/Gait Assistance: 4: Min assist Ambulation Distance (Feet): 25 Feet (10, seated rest, 15) Assistive device: Rolling walker Ambulation/Gait Assistance Details: assist to move RW in proper sequence and proper distance to allow her a larger step with LLE; steady assist; became dizzy and sat for ~3 minutes while had a drink and cold cloth Gait Pattern: Step-to pattern;Decreased stride length;Shuffle Gait velocity: Decreased Stairs: No Wheelchair Mobility Wheelchair Mobility: No    Exercises General Exercises - Lower Extremity Ankle Circles/Pumps: AAROM;AROM;Both;10 reps;Supine Heel Slides: AAROM;Left;5 reps;Supine Straight Leg Raises: AAROM;Left;10 reps   PT Diagnosis:    PT Problem List:   PT Treatment Interventions:     PT Goals (current goals can now be found in the care plan section) Acute Rehab PT Goals Patient Stated Goal: To return home PT Goal Formulation: With patient/family Time For Goal Achievement: 01/15/13 Potential to Achieve Goals: Good  Visit Information  Last PT Received On: 01/09/13 Assistance Needed: +2 History of Present Illness: Pt is a 62 y/o female admitted s/p L TKA.    Subjective Data  Subjective: reports (via interpreter) that her LLE feels very tight Patient Stated Goal: To return home   Cognition  Cognition Arousal/Alertness: Awake/alert Behavior During Therapy: WFL for tasks assessed/performed Overall Cognitive Status: Within Functional Limits for tasks assessed    Balance     End of Session PT - End of Session Equipment Utilized During Treatment: Gait belt Activity Tolerance: Patient limited by pain Patient left: with call bell/phone within reach;with family/visitor present;in chair Nurse Communication: Mobility status  GP     Bradford Cazier 01/09/2013, 1:13 PM Pager [phone]

## 2013-01-09 NOTE — Progress Notes (Signed)
     Subjective:  Patient reports pain as moderate.    Objective:   VITALS:   Filed Vitals:   01/09/13 0800 01/09/13 1200 01/09/13 1333 01/09/13 1600  BP:   132/71   Pulse:   109   Temp:   98 F (36.7 C)   TempSrc:   Oral   Resp: 20 18 20 20   Height:      Weight:      SpO2: 98% 96% 96% 96%    Dorsiflexion/Plantar flexion intact Incision: dressing C/D/I   Lab Results  Component Value Date   WBC 12.4* 01/09/2013   HGB 11.0* 01/09/2013   HCT 32.3* 01/09/2013   MCV 83.9 01/09/2013   PLT 198 01/09/2013     Assessment/Plan: 2 Days Post-Op   Active Problems:   Type II or unspecified type diabetes mellitus without mention of complication, uncontrolled   Unspecified hypothyroidism   Knee osteoarthritis   Advance diet Up with therapy D/C IV fluids Discharge home with home health versus skilled nursing facility, depending on functional capacity, which we will determine with physical therapy.   Margarita Rana, D 01/09/2013, 8:28 PM   Teryl Lucy, MD Cell [phone]

## 2013-01-09 NOTE — Progress Notes (Signed)
Pt informed RN that she had already taken her AM levothyroxin from her home medication bottle. RN held 8am dose.

## 2013-01-09 NOTE — Progress Notes (Signed)
[facility] Consult note/PROGRESS NOTE  Danielle Ferguson ZOX:[id number] DOB: 06/25/1950 DOA: 01/07/2013 PCP: No PCP Per Patient  Assessment/Plan: #1 type 2 diabetes Hemoglobin A1c was 9.3 on 10/25/2012.CBGs have improved and been ranging from 179-204. Patient has been started back on her home regimen of Amaryl, Lantus, sliding scale insulin.will increase Lantus to 24 units at bedtime and 34 units in the morning. Consult to diabetic coordinator. Follow.  #2 hypertension Stable. Continue lisinopril, HCTZ.  #3 hypothyroidism Continue Synthroid.  #4 left knee osteoarthritis/status post TKA Per primary team.  #5 hyperlipidemia Continue statin.  Code Status: full Family Communication: updated patient, daughters, son at bedside Disposition Plan: per primary team   Consultants:  [facility]  Procedures:  X-ray left knee 01/07/2013  Status post TKA 01/07/2013  Antibiotics:  none  HPI/Subjective: Patient with no complaints.  Objective: Filed Vitals:   01/09/13 1333  BP: 132/71  Pulse: 109  Temp: 98 F (36.7 C)  Resp: 20    Intake/Output Summary (Last 24 hours) at 01/09/13 1347 Last data filed at 01/09/13 0537  Gross per 24 hour  Intake   1080 ml  Output      0 ml  Net   1080 ml   Filed Weights   01/07/13 1400  Weight: 94.5 kg (208 lb 5.4 oz)    Exam:   General:  NAD  Cardiovascular: RRR  Respiratory: CTAB  Abdomen: soft, nontender, nondistended, positive bowel sounds  Musculoskeletal: no clubbing cyanosis or edema  Data Reviewed: Basic Metabolic Panel:  Recent Labs Lab 01/07/13 1835 01/08/13 0427 01/08/13 0809  NA  --  132*  --   K  --  4.5  --   CL  --  97  --   CO2  --  24  --   GLUCOSE  --  446* 452*  BUN  --  26*  --   CREATININE 0.61 0.75  --   CALCIUM  --  8.5  --    Liver Function Tests: No results found for this basename: AST, ALT, ALKPHOS, BILITOT, PROT, ALBUMIN,  in the last 168 hours No results found for  this basename: LIPASE, AMYLASE,  in the last 168 hours No results found for this basename: AMMONIA,  in the last 168 hours CBC:  Recent Labs Lab 01/07/13 1835 01/08/13 0427 01/09/13 0559  WBC 12.7* 10.3 12.4*  HGB 12.7 12.1 11.0*  HCT 37.7 35.9* 32.3*  MCV 84.3 83.9 83.9  PLT 198 195 198   Cardiac Enzymes: No results found for this basename: CKTOTAL, CKMB, CKMBINDEX, TROPONINI,  in the last 168 hours BNP (last 3 results) No results found for this basename: PROBNP,  in the last 8760 hours CBG:  Recent Labs Lab 01/08/13 1054 01/08/13 1618 01/08/13 2220 01/09/13 0634 01/09/13 1139  GLUCAP 382* 421* 179* 190* 204*    Recent Results (from the past 240 hour(s))  SURGICAL PCR SCREEN     Status: None   Collection Time    12/31/12 12:06 PM      Result Value Range Status   MRSA, PCR NEGATIVE  NEGATIVE Final   Staphylococcus aureus NEGATIVE  NEGATIVE Final   Comment:            The Xpert SA Assay (FDA     approved for NASAL specimens     in patients over 74 years of age),     is one component of     a comprehensive surveillance     program.  Test performance  has     been validated by [employer] for patients greater     than or equal to 24 year old.     It is not intended     to diagnose infection nor to     guide or monitor treatment.     Studies: Dg Knee Left Port  01/07/2013   CLINICAL DATA:  Left knee replacement.  EXAM: PORTABLE LEFT KNEE - 1-2 VIEW  COMPARISON:  None.  FINDINGS: Patient status post total left knee replacement with good anatomic alignment. No acute abnormalities identified. Soft tissue air noted consistent with surgery.  IMPRESSION: Total left knee replacement with good anatomic alignment.   Electronically Signed   By: Maisie Fus  Register   On: 01/07/2013 14:56    Scheduled Meds: . aspirin EC  81 mg Oral Daily  . docusate sodium  100 mg Oral BID  . enoxaparin (LOVENOX) injection  30 mg Subcutaneous Q12H  . glimepiride  2 mg Oral QAC breakfast   . lisinopril  20 mg Oral Daily   And  . hydrochlorothiazide  12.5 mg Oral Daily  . insulin aspart  0-15 Units Subcutaneous TID WC  . insulin glargine  20 Units Subcutaneous QHS  . insulin glargine  30 Units Subcutaneous Daily  . levothyroxine  100 mcg Oral QAC breakfast  . senna  1 tablet Oral BID  . simvastatin  40 mg Oral q1800   Continuous Infusions: . 0.45 % NaCl with KCl 20 mEq / L 75 mL/hr at 01/07/13 1825    Active Problems:   Type II or unspecified type diabetes mellitus without mention of complication, uncontrolled   Unspecified hypothyroidism   Knee osteoarthritis    Time spent: 35 mins    [facility] MD [facility] Pager [phone]. If 7PM-7AM, please contact night-coverage at [email or web address], password [facility] 01/09/2013, 1:47 PM  LOS: 2 days

## 2013-01-10 ENCOUNTER — Encounter (HOSPITAL_COMMUNITY): Payer: Self-pay | Admitting: Internal Medicine

## 2013-01-10 DIAGNOSIS — I10 Essential (primary) hypertension: Secondary | ICD-10-CM

## 2013-01-10 HISTORY — DX: Essential (primary) hypertension: I10

## 2013-01-10 LAB — CBC
MCH: 28.7 pg (ref 26.0–34.0)
MCV: 83.8 fL (ref 78.0–100.0)
Platelets: 197 10*3/uL (ref 150–400)
RBC: 3.94 MIL/uL (ref 3.87–5.11)
WBC: 7.9 10*3/uL (ref 4.0–10.5)

## 2013-01-10 LAB — BASIC METABOLIC PANEL
CO2: 29 mEq/L (ref 19–32)
Chloride: 101 mEq/L (ref 96–112)
Creatinine, Ser: 0.62 mg/dL (ref 0.50–1.10)
Sodium: 139 mEq/L (ref 135–145)

## 2013-01-10 LAB — GLUCOSE, CAPILLARY
Glucose-Capillary: 158 mg/dL — ABNORMAL HIGH (ref 70–99)
Glucose-Capillary: 183 mg/dL — ABNORMAL HIGH (ref 70–99)

## 2013-01-10 MED ORDER — INSULIN GLARGINE 100 UNIT/ML SOLOSTAR PEN: PEN_INJECTOR | SUBCUTANEOUS | Status: DC

## 2013-01-10 NOTE — Progress Notes (Signed)
Occupational Therapy Treatment Patient Details Name: Danielle Ferguson MRN: [id number] DOB: [birth year] Today's Date: 01/10/2013 Time: 4098-1191 OT Time Calculation (min): 27 min  OT Assessment / Plan / Recommendation  History of present illness Pt is a 62 y/o female admitted s/p L TKA.   OT comments  Pt limited by pain and some dizziness during session. Only able to pivot to [facility] and chair due to pain. Just had pain meds at start of session however. Pt and son agreeable now to SNF for rehab.    Follow Up Recommendations  Supervision/Assistance - 24 hour;SNF    Barriers to Discharge       Equipment Recommendations  3 in 1 bedside comode    Recommendations for Other Services    Frequency Min 2X/week   Progress towards OT Goals Progress towards OT goals: Not progressing toward goals - comment (limited due to pain)  Plan Discharge plan needs to be updated    Precautions / Restrictions Precautions Precautions: Fall;Knee Required Braces or Orthoses: Knee Immobilizer - Left Knee Immobilizer - Left: Other (comment) Restrictions Weight Bearing Restrictions: No LLE Weight Bearing: Weight bearing as tolerated   Pertinent Vitals/Pain 153/77 sitting on BSC. Pt reports dizziness 99% O2 on RA HR 108 with activity    ADL  Toilet Transfer: Performed;Moderate assistance Toilet Transfer Method: Stand pivot Toilet Transfer Equipment: Bedside commode Toileting - Clothing Manipulation and Hygiene: Performed;+1 Total assistance Where Assessed - Toileting Clothing Manipulation and Hygiene: Sit to stand from 3-in-1 or toilet Equipment Used: Knee Immobilizer;Gait belt;Rolling walker ADL Comments: Son present for session. Pt complaining of dizziness once she transferred to EOB. BP sitting on BSC 153/77. Pain is her limiting factor 5/10 at start of session and 7/10 with activity. She pivoted to [facility] and then to chair with son present for education. Discussed SNF option and pt and son are  agreeable.     OT Diagnosis:    OT Problem List:   OT Treatment Interventions:     OT Goals(current goals can now be found in the care plan section)    Visit Information  Last OT Received On: 01/10/13 Assistance Needed: +2 History of Present Illness: Pt is a 62 y/o female admitted s/p L TKA.    Subjective Data      Prior Functioning       Cognition  Cognition Arousal/Alertness: Awake/alert Behavior During Therapy: WFL for tasks assessed/performed Overall Cognitive Status: Within Functional Limits for tasks assessed    Mobility  Bed Mobility Supine to Sit: 3: Mod assist;HOB elevated;Other (comment) Details for Bed Mobility Assistance: pt needing assist for L LE and trunk to upright. Encouraged her to not use rails.  Transfers Transfers: Sit to Stand;Stand to Sit Sit to Stand: 3: Mod assist;With upper extremity assist;From bed;From chair/3-in-1 Stand to Sit: 3: Mod assist;With upper extremity assist Details for Transfer Assistance: assist to rise and steady and control descent. frequent verbal cues for hand placement and needs assist to extend L LE out in front before sitting.    Exercises      Balance Balance Balance Assessed: Yes Static Standing Balance Static Standing - Balance Support: Bilateral upper extremity supported Static Standing - Level of Assistance: 4: Min assist   End of Session OT - End of Session Equipment Utilized During Treatment: Gait belt;Rolling walker;Left knee immobilizer Activity Tolerance: Patient limited by fatigue;Patient limited by pain Patient left: in chair;with family/visitor present  GO     Lennox Laity [phone] 01/10/2013, 10:41 AM

## 2013-01-10 NOTE — Progress Notes (Signed)
Physical Therapy Treatment Patient Details Name: Danielle Ferguson MRN: [id number] DOB: 1950/03/10 Today's Date: 01/10/2013 Time: 4098-1191 PT Time Calculation (min): 32 min  PT Assessment / Plan / Recommendation  History of Present Illness Pt is a 62 y/o female admitted s/p L TKA.   PT Comments   Patient slowly progressing with mobility.  Patient mostly limited by dizziness.  Unsure of cause of dizziness (hgb low @11 .3; patient also reporting she is not eating much; BP appears fine).  Feel patient will be best serve going to a post-acute rehab facility before returning home.  Patient was improved today with mobility and distance.     Follow Up Recommendations  SNF     Does the patient have the potential to tolerate intense rehabilitation     Barriers to Discharge        Equipment Recommendations  Rolling walker with 5" wheels;3in1 (PT)    Recommendations for Other Services    Frequency 7X/week   Progress towards PT Goals Progress towards PT goals: Progressing toward goals  Plan Current plan remains appropriate    Precautions / Restrictions Precautions Precautions: Knee;Fall Required Braces or Orthoses: Knee Immobilizer - Left Knee Immobilizer - Left: Other (comment) Restrictions Weight Bearing Restrictions: No LLE Weight Bearing: Weight bearing as tolerated   Pertinent Vitals/Pain Patient only reported pain during exercises; 7/10 in left knee.  "OK" once exercises completed.    Mobility  Bed Mobility Supine to Sit: 3: Mod assist;HOB elevated;Other (comment) Details for Bed Mobility Assistance: patient in recliner when PT entered room  Transfers Transfers: Sit to Stand;Stand to Sit Sit to Stand: 4: Min assist;With upper extremity assist;From chair/3-in-1 Stand to Sit: 4: Min assist;With upper extremity assist;To chair/3-in-1 Details for Transfer Assistance: required verbal cues for sequencing Ambulation/Gait Ambulation/Gait Assistance: 4: Min assist Ambulation  Distance (Feet): 25 Feet Assistive device: Rolling walker Ambulation/Gait Assistance Details: patient taking very small steps, however, able to advance LLE herself.  Patient did complain of dizziness throughout session. Gait Pattern: Step-to pattern;Decreased step length - left Gait velocity: Decreased Stairs: No    Exercises Total Joint Exercises Ankle Circles/Pumps: AROM;Both;10 reps;Seated Quad Sets: AROM;Both;10 reps;Seated Long Arc Quad: Left;AAROM;10 reps;Seated Knee Flexion: AAROM;Left;10 reps;Seated Goniometric ROM: 59 degrees flexion   PT Diagnosis:    PT Problem List:   PT Treatment Interventions:     PT Goals (current goals can now be found in the care plan section)    Visit Information  Last PT Received On: 01/10/13 Assistance Needed: +1 History of Present Illness: Pt is a 62 y/o female admitted s/p L TKA.    Subjective Data      Cognition  Cognition Arousal/Alertness: Awake/alert Behavior During Therapy: WFL for tasks assessed/performed Overall Cognitive Status: Within Functional Limits for tasks assessed    Balance  Balance Balance Assessed: No   End of Session PT - End of Session Equipment Utilized During Treatment: Gait belt Activity Tolerance: Treatment limited secondary to medical complications (Comment) (dizziness) Patient left: in chair;with call bell/phone within reach;with family/visitor present   GP     Olivia Canter, [place] [phone] 01/10/2013, 11:25 AM

## 2013-01-10 NOTE — Progress Notes (Signed)
Physical Therapy Treatment Patient Details Name: Danielle Ferguson MRN: [id number] DOB: 12-10-1950 Today's Date: 01/10/2013 Time: 7829-5621 PT Time Calculation (min): 10 min  PT Assessment / Plan / Recommendation  History of Present Illness Pt is a 62 y/o female admitted s/p L TKA.   PT Comments   Patient refused OOB this pm - too tired.  Patient did participate in knee ROM exercises.  Feel patient will benefit from short term SNF prior to return home.  Discussed with 2nd son this pm.  Patient limited in ROM by bulky dressing and pain.  Follow Up Recommendations  SNF     Does the patient have the potential to tolerate intense rehabilitation     Barriers to Discharge        Equipment Recommendations  Rolling walker with 5" wheels;3in1 (PT)    Recommendations for Other Services    Frequency 7X/week   Progress towards PT Goals Progress towards PT goals: Progressing toward goals  Plan Current plan remains appropriate    Precautions / Restrictions Precautions Precautions: Knee;Fall Required Braces or Orthoses: Knee Immobilizer - Left Restrictions Weight Bearing Restrictions: No LLE Weight Bearing: Weight bearing as tolerated   Pertinent Vitals/Pain Pain = 7/10 during exercises, left knee.  "OK" once exercises stopped.       Exercises Total Joint Exercises Ankle Circles/Pumps: AROM;Both;10 reps;Supine Quad Sets: AROM;Both;10 reps;Supine Short Arc Quad: AAROM;Left;10 reps;Supine Heel Slides: AAROM;Left;10 reps;Supine    PT Diagnosis:    PT Problem List:   PT Treatment Interventions:     PT Goals (current goals can now be found in the care plan section)    Visit Information  Last PT Received On: 01/10/13 Assistance Needed: +1 History of Present Illness: Pt is a 62 y/o female admitted s/p L TKA.    Subjective Data      Cognition  Cognition Arousal/Alertness: Awake/alert Behavior During Therapy: WFL for tasks assessed/performed Overall Cognitive Status: Within  Functional Limits for tasks assessed    Balance  Balance Balance Assessed: No  End of Session PT - End of Session Equipment Utilized During Treatment: Gait belt Activity Tolerance: Patient limited by fatigue Patient left: in bed;with call bell/phone within reach;with family/visitor present   GP     Olivia Canter 01/10/2013, 2:55 PM

## 2013-01-10 NOTE — Progress Notes (Signed)
Rehab Admissions Coordinator Note:  Patient was screened by Trish Mage for appropriateness for an Inpatient Acute Rehab Consult.  At this time, we are recommending Skilled Nursing Facility.  Trish Mage 01/10/2013, 11:37 AM  I can be reached at [phone].

## 2013-01-10 NOTE — Care Management Note (Signed)
CARE MANAGEMENT NOTE 01/10/2013  Patient:  Danielle Ferguson, Danielle Ferguson   Account Number:  [id number]  Date Initiated:  01/08/2013  Documentation initiated by:  Vance Peper  Subjective/Objective Assessment:   62yr old female s/p lleft total knee arthroplasty.       Anticipated DC Date:  01/11/2013   Anticipated DC Plan:  SKILLED NURSING FACILITY  In-house referral  Clinical Social Worker        Status of service:  completed Medicare Important Message given?   (If response is "NO", the following Medicare IM given date fields will be blank) Date Medicare IM given:   Date Additional Medicare IM given:    Discharge Disposition:  SKILLED NURSING FACILITY  Comments:  01/10/13 Vance Peper, RN BSN Case Manager [phone] Patient's sons and patient  have agreed that she will need shortterm rehab at [facility]. Social worker notified.

## 2013-01-10 NOTE — Progress Notes (Signed)
[facility] Consult note/PROGRESS NOTE  Danielle Ferguson ZOX:[id number] DOB: [birth year] DOA: 01/07/2013 PCP: Elvina Sidle, MD  Assessment/Plan: #1 type 2 diabetes Hemoglobin A1c was 9.3 on 10/25/2012.CBGs have improved and been ranging from 137-158. Patient has been started back on her home regimen of Amaryl, Lantus, sliding scale insulin. Continue Lantus to 24 units at bedtime and 34 units in the morning. Consult to diabetic coordinator. Follow.  #2 hypertension Stable. Continue lisinopril, HCTZ.  #3 hypothyroidism Continue Synthroid.  #4 left knee osteoarthritis/status post TKA Per primary team.  #5 hyperlipidemia Continue statin.  Code Status: full Family Communication: updated patient, daughters, son at bedside Disposition Plan: per primary team   Consultants:  [facility]  Procedures:  X-ray left knee 01/07/2013  Status post TKA 01/07/2013  Antibiotics:  none  HPI/Subjective: Patient with no complaints.  Objective: Filed Vitals:   01/10/13 1246  BP: 167/76  Pulse: 104  Temp: 97.7 F (36.5 C)  Resp: 18    Intake/Output Summary (Last 24 hours) at 01/10/13 1519 Last data filed at 01/10/13 1246  Gross per 24 hour  Intake    720 ml  Output      0 ml  Net    720 ml   Filed Weights   01/07/13 1400  Weight: 94.5 kg (208 lb 5.4 oz)    Exam:   General:  NAD  Cardiovascular: RRR  Respiratory: CTAB  Abdomen: soft, nontender, nondistended, positive bowel sounds  Musculoskeletal: no clubbing cyanosis or edema  Data Reviewed: Basic Metabolic Panel:  Recent Labs Lab 01/07/13 1835 01/08/13 0427 01/08/13 0809 01/10/13 0515  NA  --  132*  --  139  K  --  4.5  --  3.6  CL  --  97  --  101  CO2  --  24  --  29  GLUCOSE  --  446* 452* 157*  BUN  --  26*  --  15  CREATININE 0.61 0.75  --  0.62  CALCIUM  --  8.5  --  8.6   Liver Function Tests: No results found for this basename: AST, ALT, ALKPHOS, BILITOT, PROT, ALBUMIN,   in the last 168 hours No results found for this basename: LIPASE, AMYLASE,  in the last 168 hours No results found for this basename: AMMONIA,  in the last 168 hours CBC:  Recent Labs Lab 01/07/13 1835 01/08/13 0427 01/09/13 0559 01/10/13 0515  WBC 12.7* 10.3 12.4* 7.9  HGB 12.7 12.1 11.0* 11.3*  HCT 37.7 35.9* 32.3* 33.0*  MCV 84.3 83.9 83.9 83.8  PLT 198 195 198 197   Cardiac Enzymes: No results found for this basename: CKTOTAL, CKMB, CKMBINDEX, TROPONINI,  in the last 168 hours BNP (last 3 results) No results found for this basename: PROBNP,  in the last 8760 hours CBG:  Recent Labs Lab 01/09/13 1139 01/09/13 1620 01/09/13 2235 01/10/13 0629 01/10/13 1117  GLUCAP 204* 283* 187* 158* 137*    No results found for this or any previous visit (from the past 240 hour(s)).   Studies: No results found.  Scheduled Meds: . aspirin EC  81 mg Oral Daily  . docusate sodium  100 mg Oral BID  . enoxaparin (LOVENOX) injection  30 mg Subcutaneous Q12H  . glimepiride  2 mg Oral QAC breakfast  . lisinopril  20 mg Oral Daily   And  . hydrochlorothiazide  12.5 mg Oral Daily  . insulin aspart  0-15 Units Subcutaneous TID WC  . insulin glargine  24 Units Subcutaneous  QHS  . insulin glargine  34 Units Subcutaneous Daily  . levothyroxine  100 mcg Oral QAC breakfast  . senna  1 tablet Oral BID  . simvastatin  40 mg Oral q1800   Continuous Infusions: . 0.45 % NaCl with KCl 20 mEq / L 75 mL/hr at 01/07/13 1825    Principal Problem:   Knee osteoarthritis Active Problems:   Type II or unspecified type diabetes mellitus without mention of complication, uncontrolled   Unspecified hypothyroidism   HTN (hypertension)    Time spent: 35 mins    [facility] MD [facility] Pager [phone]. If 7PM-7AM, please contact night-coverage at [email or web address], password [facility] 01/10/2013, 3:19 PM  LOS: 3 days

## 2013-01-10 NOTE — Progress Notes (Signed)
     Subjective:  Patient reports pain as moderate.  Slow with PT.  Will need SNF.  Objective:   VITALS:   Filed Vitals:   01/10/13 1120 01/10/13 1148 01/10/13 1246 01/10/13 1600  BP: 142/77  167/76   Pulse:   104   Temp:   97.7 F (36.5 C)   TempSrc:      Resp:  18 18 18   Height:      Weight:      SpO2:  100% 99% 100%    Neurologically intact Sensation intact distally Dressing changed.  Wounds dry.  EHL FHL intact.  Lab Results  Component Value Date   WBC 7.9 01/10/2013   HGB 11.3* 01/10/2013   HCT 33.0* 01/10/2013   MCV 83.8 01/10/2013   PLT 197 01/10/2013     Assessment/Plan: 3 Days Post-Op   Principal Problem:   Knee osteoarthritis Active Problems:   Type II or unspecified type diabetes mellitus without mention of complication, uncontrolled   Unspecified hypothyroidism   HTN (hypertension)   Advance diet Up with therapy Discharge to SNF when bed available.  SW consult already submitted.   Danielle Ferguson P 01/10/2013, 6:53 PM   Teryl Lucy, MD Cell [phone]

## 2013-01-11 LAB — GLUCOSE, CAPILLARY: Glucose-Capillary: 169 mg/dL — ABNORMAL HIGH (ref 70–99)

## 2013-01-11 LAB — BASIC METABOLIC PANEL
BUN: 13 mg/dL (ref 6–23)
CO2: 27 mEq/L (ref 19–32)
Calcium: 8.4 mg/dL (ref 8.4–10.5)
GFR calc non Af Amer: >90 mL/min (ref >90)
Glucose, Bld: 203 mg/dL — ABNORMAL HIGH (ref 70–99)
Potassium: 4.3 mEq/L (ref 3.5–5.1)
Sodium: 133 mEq/L — ABNORMAL LOW (ref 135–145)

## 2013-01-11 MED ORDER — ACETAMINOPHEN 500 MG PO TABS
500.0000 mg | ORAL_TABLET | Freq: Three times a day (TID) | ORAL | Status: DC
  Administered 2013-01-11 – 2013-01-13 (×8): 500 mg via ORAL
  Filled 2013-01-11 (×10): qty 1

## 2013-01-11 MED ORDER — INSULIN GLARGINE 100 UNIT/ML ~~LOC~~ SOLN
26.0000 [IU] | Freq: Every day | SUBCUTANEOUS | Status: DC
  Administered 2013-01-11 – 2013-01-12 (×2): 26 [IU] via SUBCUTANEOUS
  Filled 2013-01-11 (×3): qty 0.26

## 2013-01-11 MED ORDER — INSULIN GLARGINE 100 UNIT/ML ~~LOC~~ SOLN
36.0000 [IU] | Freq: Every day | SUBCUTANEOUS | Status: DC
  Administered 2013-01-12 – 2013-01-13 (×2): 36 [IU] via SUBCUTANEOUS
  Filled 2013-01-11 (×2): qty 0.36

## 2013-01-11 MED ORDER — MORPHINE SULFATE 15 MG PO TABS
15.0000 mg | ORAL_TABLET | ORAL | Status: DC | PRN
  Administered 2013-01-11 – 2013-01-13 (×9): 15 mg via ORAL
  Administered 2013-01-13: 30 mg via ORAL
  Administered 2013-01-13: 15 mg via ORAL
  Filled 2013-01-11 (×8): qty 1
  Filled 2013-01-11: qty 2
  Filled 2013-01-11 (×2): qty 1

## 2013-01-11 NOTE — Progress Notes (Signed)
[facility] Consult note/PROGRESS NOTE  Danielle Ferguson AVW:[id number] DOB: 02-23-1950 DOA: 01/07/2013 PCP: Elvina Sidle, MD  Assessment/Plan: #1 type 2 diabetes Hemoglobin A1c was 9.3 on 10/25/2012.CBGs have improved and been ranging from 137-158. Patient has been started back on her home regimen of Amaryl, Lantus, sliding scale insulin. Increase Lantus to 26 units at bedtime and 36 units in the morning. Consult to diabetic coordinator. Follow.  #2 hypertension Stable. Continue lisinopril, HCTZ.  #3 hypothyroidism Continue Synthroid.  #4 left knee osteoarthritis/status post TKA Per primary team.  #5 hyperlipidemia Continue statin.   Will sign off. Please call with questions.    Code Status: full Family Communication: updated patient and son at bedside Disposition Plan: per primary team   Consultants:  [facility]  Procedures:  X-ray left knee 01/07/2013  Status post TKA 01/07/2013  Antibiotics:  none  HPI/Subjective: Patient with no complaints. Patient looks in pain.  Objective: Filed Vitals:   01/11/13 0726  BP:   Pulse:   Temp:   Resp: 18    Intake/Output Summary (Last 24 hours) at 01/11/13 1112 Last data filed at 01/11/13 0659  Gross per 24 hour  Intake    960 ml  Output   1100 ml  Net   -140 ml   Filed Weights   01/07/13 1400  Weight: 94.5 kg (208 lb 5.4 oz)    Exam:   General:  NAD  Cardiovascular: RRR  Respiratory: CTAB  Abdomen: soft, nontender, nondistended, positive bowel sounds  Musculoskeletal: no clubbing cyanosis or edema  Data Reviewed: Basic Metabolic Panel:  Recent Labs Lab 01/07/13 1835 01/08/13 0427 01/08/13 0809 01/10/13 0515 01/11/13 0635  NA  --  132*  --  139 133*  K  --  4.5  --  3.6 4.3  CL  --  97  --  101 99  CO2  --  24  --  29 27  GLUCOSE  --  446* 452* 157* 203*  BUN  --  26*  --  15 13  CREATININE 0.61 0.75  --  0.62 0.65  CALCIUM  --  8.5  --  8.6 8.4   Liver Function  Tests: No results found for this basename: AST, ALT, ALKPHOS, BILITOT, PROT, ALBUMIN,  in the last 168 hours No results found for this basename: LIPASE, AMYLASE,  in the last 168 hours No results found for this basename: AMMONIA,  in the last 168 hours CBC:  Recent Labs Lab 01/07/13 1835 01/08/13 0427 01/09/13 0559 01/10/13 0515  WBC 12.7* 10.3 12.4* 7.9  HGB 12.7 12.1 11.0* 11.3*  HCT 37.7 35.9* 32.3* 33.0*  MCV 84.3 83.9 83.9 83.8  PLT 198 195 198 197   Cardiac Enzymes: No results found for this basename: CKTOTAL, CKMB, CKMBINDEX, TROPONINI,  in the last 168 hours BNP (last 3 results) No results found for this basename: PROBNP,  in the last 8760 hours CBG:  Recent Labs Lab 01/10/13 0629 01/10/13 1117 01/10/13 1629 01/10/13 2139 01/11/13 0625  GLUCAP 158* 137* 180* 183* 183*    No results found for this or any previous visit (from the past 240 hour(s)).   Studies: No results found.  Scheduled Meds: . aspirin EC  81 mg Oral Daily  . docusate sodium  100 mg Oral BID  . enoxaparin (LOVENOX) injection  30 mg Subcutaneous Q12H  . glimepiride  2 mg Oral QAC breakfast  . lisinopril  20 mg Oral Daily   And  . hydrochlorothiazide  12.5 mg Oral Daily  .  insulin aspart  0-15 Units Subcutaneous TID WC  . insulin glargine  24 Units Subcutaneous QHS  . insulin glargine  34 Units Subcutaneous Daily  . levothyroxine  100 mcg Oral QAC breakfast  . senna  1 tablet Oral BID  . simvastatin  40 mg Oral q1800   Continuous Infusions: . 0.45 % NaCl with KCl 20 mEq / L 75 mL/hr at 01/07/13 1825    Principal Problem:   Knee osteoarthritis Active Problems:   Type II or unspecified type diabetes mellitus without mention of complication, uncontrolled   Unspecified hypothyroidism   HTN (hypertension)    Time spent: 35 mins    [facility] MD [facility] Pager [phone]. If 7PM-7AM, please contact night-coverage at [email or web address], password [facility] 01/11/2013, 11:12 AM   LOS: 4 days

## 2013-01-11 NOTE — Progress Notes (Signed)
Physical Therapy Treatment Patient Details Name: Danielle Ferguson MRN: [id number] DOB: December 08, 1950 Today's Date: 01/11/2013 Time: 4098-1191 PT Time Calculation (min): 26 min  PT Assessment / Plan / Recommendation  History of Present Illness     PT Comments   Pt limited today by pain.  Pt's son used as Equities trader.    Follow Up Recommendations  SNF     Does the patient have the potential to tolerate intense rehabilitation     Barriers to Discharge        Equipment Recommendations  Rolling walker with 5" wheels;3in1 (PT)    Recommendations for Other Services    Frequency 7X/week   Progress towards PT Goals Progress towards PT goals: Progressing toward goals  Plan Current plan remains appropriate    Precautions / Restrictions Precautions Precautions: Knee;Fall Required Braces or Orthoses: Knee Immobilizer - Left Restrictions LLE Weight Bearing: Weight bearing as tolerated   Pertinent Vitals/Pain 9/10    Mobility  Bed Mobility Supine to Sit: 3: Mod assist;HOB elevated;With rails Sitting - Scoot to Edge of Bed: 3: Mod assist;With rail Transfers Sit to Stand: 3: Mod assist;From bed;With upper extremity assist;From chair/3-in-1 Stand to Sit: 4: Min assist;With upper extremity assist;To chair/3-in-1 Details for Transfer Assistance: verbal cues for sequencing Ambulation/Gait Ambulation/Gait Assistance: 4: Min assist Ambulation Distance (Feet): 10 Feet Assistive device: Rolling walker Gait Pattern: Step-to pattern;Decreased step length - left Gait velocity: decreased    Exercises Total Joint Exercises Ankle Circles/Pumps: AROM;Both;10 reps;Supine Quad Sets: AROM;Left;10 reps;Supine Heel Slides: AAROM;Left;5 reps;Supine Hip ABduction/ADduction: AAROM;Left;10 reps;Supine   PT Diagnosis:    PT Problem List:   PT Treatment Interventions:     PT Goals (current goals can now be found in the care plan section)    Visit Information  Last PT Received On:  01/11/13 Assistance Needed: +1    Subjective Data      Cognition  Cognition Arousal/Alertness: Awake/alert Behavior During Therapy: WFL for tasks assessed/performed Overall Cognitive Status: Within Functional Limits for tasks assessed    Balance     End of Session PT - End of Session Equipment Utilized During Treatment: Gait belt Activity Tolerance: Patient limited by pain Patient left: in chair;with call bell/phone within reach;with family/visitor present Nurse Communication: Mobility status   GP     [name] 01/11/2013, 11:59 AM  Aida Raider, PT  Office # [phone] Pager [phone]

## 2013-01-11 NOTE — Progress Notes (Signed)
Clinical Social Work Department BRIEF PSYCHOSOCIAL ASSESSMENT 01/11/2013  Patient:  Danielle Ferguson, Danielle Ferguson     Account Number:  [id number]     Admit date:  01/07/2013  Clinical Social Worker:  Hendricks Milo  Date/Time:  01/11/2013 07:12 PM  Referred by:  Physician  Date Referred:  01/10/2013 Referred for  SNF Placement   Other Referral:   Interview type:  Family Other interview type:    PSYCHOSOCIAL DATA Living Status:  FAMILY Admitted from facility:   Level of care:   Primary support name:  Danielle Ferguson [phone] Primary support relationship to patient:  CHILD, ADULT Degree of support available:   Very supportive lives with patient.    CURRENT CONCERNS  Other Concerns:    SOCIAL WORK ASSESSMENT / PLAN Clinical Social Worker (CSW) met with patient to discuss SNF placement. Patient does not speak English and reported through her daughter in law Danielle Ferguson who was at bedside that she wants to go home. Daughter in law gave CSW permission to call patient's son Danielle Ferguson. Son reported that he would like for patient to go to SNF for short term rehab and that he would talk to patient about it. Son was agreeable to SNF search in [place].   Assessment/plan status:  Psychosocial Support/Ongoing Assessment of Needs Other assessment/ plan:   Information/referral to community resources:   CSW gave daughter-in-law SNF list.    PATIENT'S/FAMILY'S RESPONSE TO PLAN OF CARE: Son thanked CSW for calling.

## 2013-01-11 NOTE — Progress Notes (Signed)
Subjective: 4 Days Post-Op Procedure(s) (LRB): TOTAL KNEE ARTHROPLASTY (Left) Patient reports pain as 5 on 0-10 scale.  Voiding without difficulty. eating with no nausea Does report some light headedness when arising Objective: Vital signs in last 24 hours: Temp:  [99.1 F (37.3 C)-99.8 F (37.7 C)] 99.3 F (37.4 C) (12/27 1253) Pulse Rate:  [94-104] 95 (12/27 1253) Resp:  [16-20] 18 (12/27 1253) BP: (111-127)/(65-69) 111/65 mmHg (12/27 1253) SpO2:  [94 %-96 %] 96 % (12/27 1253)  Intake/Output from previous day: 12/26 0701 - 12/27 0700 In: 960 [P.O.:960] Out: 1100 [Urine:1100] Intake/Output this shift: Total I/O In: 480 [P.O.:480] Out: -    Recent Labs  01/09/13 0559 01/10/13 0515  HGB 11.0* 11.3*    Recent Labs  01/09/13 0559 01/10/13 0515  WBC 12.4* 7.9  RBC 3.85* 3.94  HCT 32.3* 33.0*  PLT 198 197    Recent Labs  01/10/13 0515 01/11/13 0635  NA 139 133*  K 3.6 4.3  CL 101 99  CO2 29 27  BUN 15 13  CREATININE 0.62 0.65  GLUCOSE 157* 203*  CALCIUM 8.6 8.4   No results found for this basename: LABPT, INR,  in the last 72 hours Dressing dry Neurovascular intact Dorsiflexion/Plantar flexion intact No cellulitis present Compartment soft  Assessment/Plan: 4 Days Post-Op Procedure(s) (LRB): TOTAL KNEE ARTHROPLASTY (Left) Up with therapy Discharge to SNF when bed available  Danielle Ferguson B 01/11/2013, 4:00 PM

## 2013-01-12 LAB — BASIC METABOLIC PANEL
BUN: 11 mg/dL (ref 6–23)
CO2: 28 mEq/L (ref 19–32)
Calcium: 8.5 mg/dL (ref 8.4–10.5)
GFR calc Af Amer: >90 mL/min (ref >90)
GFR calc non Af Amer: >90 mL/min (ref >90)
Glucose, Bld: 153 mg/dL — ABNORMAL HIGH (ref 70–99)
Potassium: 4.4 mEq/L (ref 3.5–5.1)
Sodium: 135 mEq/L (ref 135–145)

## 2013-01-12 LAB — GLUCOSE, CAPILLARY
Glucose-Capillary: 190 mg/dL — ABNORMAL HIGH (ref 70–99)
Glucose-Capillary: 142 mg/dL — ABNORMAL HIGH (ref 70–99)
Glucose-Capillary: 90 mg/dL (ref 70–99)
Glucose-Capillary: 261 mg/dL — ABNORMAL HIGH (ref 70–99)
Glucose-Capillary: 222 mg/dL — ABNORMAL HIGH (ref 70–99)

## 2013-01-12 NOTE — Progress Notes (Signed)
Clinical Child psychotherapist (CSW) gave patient's daughter-in-law a list of private sitters that can be hired by the family. I explained to the daughter-in-law that the sitters will have to be paid by the family and will be an out of pocket expense. Daughter-in-law verbalized her understanding.   Jetta Lout, LCSWA Weekend CSW [phone]

## 2013-01-12 NOTE — Progress Notes (Signed)
Physical Therapy Treatment Patient Details Name: Danielle Ferguson MRN: [id number] DOB: 29-Jan-1950 Today's Date: 01/12/2013 Time: 4098-1191 PT Time Calculation (min): 23 min  PT Assessment / Plan / Recommendation  History of Present Illness     PT Comments   Good progress noted with gait today.  Pt requires encouragement to increase gait distance.  Follow Up Recommendations  SNF     Does the patient have the potential to tolerate intense rehabilitation     Barriers to Discharge        Equipment Recommendations  Rolling walker with 5" wheels    Recommendations for Other Services    Frequency 7X/week   Progress towards PT Goals Progress towards PT goals: Progressing toward goals  Plan Current plan remains appropriate    Precautions / Restrictions Precautions Precautions: Knee;Fall Required Braces or Orthoses: Knee Immobilizer - Left Restrictions LLE Weight Bearing: Weight bearing as tolerated   Pertinent Vitals/Pain 5/10    Mobility  Transfers Sit to Stand: 4: Min assist;From chair/3-in-1;With armrests Stand to Sit: 4: Min guard;To chair/3-in-1;With armrests Details for Transfer Assistance: verbal cues for sequencing Ambulation/Gait Ambulation/Gait Assistance: 4: Min guard Ambulation Distance (Feet): 75 Feet Assistive device: Rolling walker Ambulation/Gait Assistance Details: verbal cues for RW management, safety Gait Pattern: Step-to pattern;Antalgic Gait velocity: decreased    Exercises Total Joint Exercises Ankle Circles/Pumps: AROM;Both;10 reps Quad Sets: AROM;Left;10 reps Heel Slides: AAROM;Left;10 reps Hip ABduction/ADduction: AAROM;Left;10 reps Straight Leg Raises: AAROM;Left;10 reps Goniometric ROM: 0-60 degrees L knee   PT Diagnosis:    PT Problem List:   PT Treatment Interventions:     PT Goals (current goals can now be found in the care plan section)    Visit Information  Last PT Received On: 01/12/13 Assistance Needed: +1    Subjective  Data      Cognition  Cognition Arousal/Alertness: Awake/alert Behavior During Therapy: WFL for tasks assessed/performed Overall Cognitive Status: Within Functional Limits for tasks assessed    Balance     End of Session PT - End of Session Equipment Utilized During Treatment: Gait belt Activity Tolerance: Patient tolerated treatment well Patient left: in chair;with call bell/phone within reach;with family/visitor present Nurse Communication: Mobility status   GP     [name] 01/12/2013, 12:43 PM  Aida Raider, PT  Office # [phone] Pager [phone]

## 2013-01-12 NOTE — Progress Notes (Signed)
Clinical Social Work Department CLINICAL SOCIAL WORK PLACEMENT NOTE 01/12/2013  Patient:  Danielle Ferguson, Danielle Ferguson  Account Number:  [id number] Admit date:  01/07/2013  Clinical Social Worker:  Samuella Bruin, Theresia Majors  Date/time:  01/12/2013 05:53 PM  Clinical Social Work is seeking post-discharge placement for this patient at the following level of care:   SKILLED NURSING   (*CSW will update this form in Epic as items are completed)   01/12/2013  Patient/family provided with [facility] Department of Clinical Social Work's list of facilities offering this level of care within the geographic area requested by the patient (or if unable, by the patient's family).  01/12/2013  Patient/family informed of their freedom to choose among providers that offer the needed level of care, that participate in Medicare, Medicaid or managed care program needed by the patient, have an available bed and are willing to accept the patient.  01/12/2013  Patient/family informed of [facility]' ownership interest in [facility], as well as of the fact that they are under no obligation to receive care at this facility.  PASARR submitted to EDS on  PASARR number received from EDS on   FL2 transmitted to all facilities in geographic area requested by pt/family on  01/12/2013 FL2 transmitted to all facilities within larger geographic area on   Patient informed that his/her managed care company has contracts with or will negotiate with  certain facilities, including the following:     Patient/family informed of bed offers received:   Patient chooses bed at  Physician recommends and patient chooses bed at    Patient to be transferred to  on   Patient to be transferred to facility by   The following physician request were entered in Epic:   Additional Comments:  Samuella Bruin, MSW, LCSWA Clinical Social Worker [facility]. [phone]

## 2013-01-12 NOTE — Progress Notes (Signed)
Subjective: 5 Days Post-Op Procedure(s) (LRB): TOTAL KNEE ARTHROPLASTY (Left) Patient reports pain as 5 on 0-10 scale.   This patient does not speak English and is very anxious about Movement.  I was able to get her to walk to the bathroom with max encouragement from me and her son.  I stressed the importance of moving to prevent blood clots and pneumonia.   Objective: Vital signs in last 24 hours: Temp:  [98.2 F (36.8 C)-99.3 F (37.4 C)] 98.8 F (37.1 C) (12/28 0513) Pulse Rate:  [84-96] 84 (12/28 0513) Resp:  [16-18] 16 (12/28 0513) BP: (111-149)/(65-67) 126/65 mmHg (12/28 0513) SpO2:  [94 %-96 %] 96 % (12/28 0513)  Intake/Output from previous day: 12/27 0701 - 12/28 0700 In: 720 [P.O.:720] Out: 350 [Urine:350] Intake/Output this shift:     Recent Labs  01/10/13 0515  HGB 11.3*    Recent Labs  01/10/13 0515  WBC 7.9  RBC 3.94  HCT 33.0*  PLT 197    Recent Labs  01/11/13 0635 01/12/13 0500  NA 133* 135  K 4.3 4.4  CL 99 99  CO2 27 28  BUN 13 11  CREATININE 0.65 0.68  GLUCOSE 203* 153*  CALCIUM 8.4 8.5   No results found for this basename: LABPT, INR,  in the last 72 hours  ABD soft Neurovascular intact Sensation intact distally Intact pulses distally Incision: dressing C/D/I  Assessment/Plan: 5 Days Post-Op Procedure(s) (LRB): TOTAL KNEE ARTHROPLASTY (Left) Advance diet Up with therapy Plan on SNF in AM.    Julien Girt J 01/12/2013, 10:22 AM

## 2013-01-13 LAB — GLUCOSE, CAPILLARY: Glucose-Capillary: 243 mg/dL — ABNORMAL HIGH (ref 70–99)

## 2013-01-13 MED ORDER — LIVING WELL WITH DIABETES BOOK
Freq: Once | Status: AC
  Administered 2013-01-13
  Filled 2013-01-13: qty 1

## 2013-01-13 NOTE — Progress Notes (Signed)
Pt c/o her throat being sore and insisted that she was not getting her synthroid. Reassured her as her son translated that she had being receiving her synthroid and all of her home meds. Gave her throat spray and medicated with pain meds. According to the son , she had a nightmare. She continued to yell at him for 20 minutes.

## 2013-01-13 NOTE — Progress Notes (Signed)
Went over d/c instructions with pt's sons verbalized understanding went over giving lovenox inj verbalized understanding

## 2013-01-13 NOTE — Progress Notes (Addendum)
Patient ID: Danielle Ferguson, female   DOB: 11-30-1950, 62 y.o.   MRN: [id number]     Subjective:  Patient reports pain as mild to moderate.  Patient is not doing very much due to pain patient still requiring knee immobilizer.  Objective:   VITALS:   Filed Vitals:   01/12/13 1557 01/12/13 1600 01/12/13 2148 01/13/13 0530  BP: 118/70  119/69 117/70  Pulse: 109  68 86  Temp: 99 F (37.2 C)  99.3 F (37.4 C) 99 F (37.2 C)  TempSrc: Oral  Oral Oral  Resp: 18 18 20 20   Height:      Weight:      SpO2: 98% 98% 100% 98%    ABD soft Sensation intact distally Dorsiflexion/Plantar flexion intact Incision: dressing C/D/I and no drainage Tried to encourage the patient and her son that was translating to her that she needs to be moving more and that she has to be ambulating more to avoid further setbacks. Knee would looks good no sign of infection.   Knee ROM 0-40 due to pain   Lab Results  Component Value Date   WBC 7.9 01/10/2013   HGB 11.3* 01/10/2013   HCT 33.0* 01/10/2013   MCV 83.8 01/10/2013   PLT 197 01/10/2013     Assessment/Plan: 6 Days Post-Op   Principal Problem:   Knee osteoarthritis Active Problems:   Type II or unspecified type diabetes mellitus without mention of complication, uncontrolled   Unspecified hypothyroidism   HTN (hypertension)   Advance diet Up with therapy Discharge home with home health or SNF placement if family willing. WBAT DC knee immobilizer.    Haskel Khan 01/13/2013, 8:39 AM  Discussed with Janace Litten, and seen and agree with above.  Plan for discharge if safe dispo available. Family doesn't want SNF, but doesn't have the resources to dc home.  They now have a list of SNF possibilities, and will plan for SNF tomorrow if bed accepted.    Teryl Lucy, MD Cell [phone]

## 2013-01-13 NOTE — Progress Notes (Signed)
Inpatient Diabetes Program Recommendations  AACE/ADA: New Consensus Statement on Inpatient Glycemic Control (2013)  Target Ranges:  Prepandial:   less than 140 mg/dL      Peak postprandial:   less than 180 mg/dL (1-2 hours)      Critically ill patients:  140 - 180 mg/dL  Results for Su, [facility] (MRN [id number]) as of 01/13/2013 12:20  Ref. Range 01/12/2013 06:44 01/12/2013 16:36 01/12/2013 21:50 01/13/2013 07:07 01/13/2013 11:39  Glucose-Capillary Latest Range: 70-99 mg/dL 90 981 (H) 191 (H) 478 (H) 243 (H)    Inpatient Diabetes Program Recommendations Insulin - Meal Coverage: consider adding Novolog with meals for elevated postprandial CBGs  Fasting CBG is WNL at 90-100.  Thank you  Piedad Climes BSN, RN,CDE Inpatient Diabetes Coordinator [phone] (team pager)

## 2013-01-13 NOTE — Discharge Summary (Signed)
Physician Discharge Summary  Patient ID: Lainie Daubert MRN: [id number] DOB/AGE: 61-Nov-1952 61 y.o.  Admit date: 01/07/2013 Discharge date: 01/13/2013  Admission Diagnoses:  Knee osteoarthritis  Discharge Diagnoses:  Principal Problem:   Knee osteoarthritis Active Problems:   Type II or unspecified type diabetes mellitus without mention of complication, uncontrolled   Unspecified hypothyroidism   HTN (hypertension)   Past Medical History  Diagnosis Date  . Hypertension   . Diabetes mellitus without complication   . High cholesterol   . Thyroid disease   . Chronic knee pain   . Shortness of breath     with walking  . Headache(784.0)   . Constipation   . Hypothyroidism   . HTN (hypertension) 01/10/2013    Surgeries: Procedure(s): TOTAL KNEE ARTHROPLASTY on 01/07/2013   Consultants (if any):    Discharged Condition: Improved  Hospital Course: Danielle Ferguson is an 62 y.o. female who was admitted 01/07/2013 with a diagnosis of Knee osteoarthritis and went to the operating room on 01/07/2013 and underwent the above named procedures.    She was given perioperative antibiotics:  Anti-infectives   Start     Dose/Rate Route Frequency Ordered Stop   01/07/13 1800  ceFAZolin (ANCEF) IVPB 2 g/50 mL premix     2 g 100 mL/hr over 30 Minutes Intravenous Every 6 hours 01/07/13 1655 01/07/13 2326   01/07/13 0600  ceFAZolin (ANCEF) IVPB 2 g/50 mL premix     2 g 100 mL/hr over 30 Minutes Intravenous On call to O.R. 01/06/13 1242 01/07/13 1045    .  She was given sequential compression devices, early ambulation, and lovenox for DVT prophylaxis.  She benefited maximally from the hospital stay and there were no complications.    Recent vital signs:  Filed Vitals:   01/13/13 1200  BP:   Pulse:   Temp:   Resp: 16    Recent laboratory studies:  Lab Results  Component Value Date   HGB 11.3* 01/10/2013   HGB 11.0* 01/09/2013   HGB 12.1 01/08/2013   Lab Results   Component Value Date   WBC 7.9 01/10/2013   PLT 197 01/10/2013   Lab Results  Component Value Date   INR 1.03 12/31/2012   Lab Results  Component Value Date   NA 135 01/12/2013   K 4.4 01/12/2013   CL 99 01/12/2013   CO2 28 01/12/2013   BUN 11 01/12/2013   CREATININE 0.68 01/12/2013   GLUCOSE 153* 01/12/2013    Discharge Medications:     Medication List    STOP taking these medications       diclofenac 75 MG EC tablet  Commonly known as:  VOLTAREN     ibuprofen 600 MG tablet  Commonly known as:  ADVIL,MOTRIN      TAKE these medications       aspirin 81 MG tablet  Take 81 mg by mouth daily.     docusate sodium 100 MG capsule  Commonly known as:  COLACE  Take 100 mg by mouth 2 (two) times daily.     enoxaparin 30 MG/0.3ML injection  Commonly known as:  LOVENOX  Inject 0.3 mLs (30 mg total) into the skin every 12 (twelve) hours.     glimepiride 2 MG tablet  Commonly known as:  AMARYL  Take 1 tablet (2 mg total) by mouth daily before breakfast.     Insulin Glargine 100 UNIT/ML Sopn  Commonly known as:  LANTUS SOLOSTAR  - PATIENT NEEDS OFFICE VISIT FOR ADDITIONAL  REFILLS  - Take 34 units in the morning and 24 units at bedtime.     levothyroxine 100 MCG tablet  Commonly known as:  SYNTHROID, LEVOTHROID  Take 1 tab by mouth daily on Mon, Wed, Fri, and 1/2 tab other days. PATIENT NEEDS OFFICE VISIT FOR ADDITIONAL REFILLS     lisinopril-hydrochlorothiazide 20-12.5 MG per tablet  Commonly known as:  PRINZIDE,ZESTORETIC  Take 1 tablet by mouth daily. PATIENT NEEDS OFFICE VISIT FOR ADDITIONAL REFILLS     metFORMIN 1000 MG tablet  Commonly known as:  GLUCOPHAGE  Take 1 tablet (1,000 mg total) by mouth 2 (two) times daily with a meal.     methocarbamol 500 MG tablet  Commonly known as:  ROBAXIN  Take 1 tablet (500 mg total) by mouth 4 (four) times daily.     oxyCODONE-acetaminophen 10-325 MG per tablet  Commonly known as:  PERCOCET  Take 1-2 tablets by  mouth every 6 (six) hours as needed for pain. MAXIMUM TOTAL ACETAMINOPHEN DOSE IS 4000 MG PER DAY     promethazine 25 MG tablet  Commonly known as:  PHENERGAN  Take 1 tablet (25 mg total) by mouth every 6 (six) hours as needed for nausea or vomiting.     sennosides-docusate sodium 8.6-50 MG tablet  Commonly known as:  SENOKOT-S  Take 2 tablets by mouth daily.     simvastatin 40 MG tablet  Commonly known as:  ZOCOR  Take 40 mg by mouth daily.        Diagnostic Studies: Dg Chest 2 View  12/31/2012   CLINICAL DATA:  Hypertension.  EXAM: CHEST  2 VIEW  COMPARISON:  None.  FINDINGS: The heart size and mediastinal contours are within normal limits. Both lungs are clear. The visualized skeletal structures are unremarkable.  IMPRESSION: No active cardiopulmonary disease.   Electronically Signed   By: Roque Lias M.D.   On: 12/31/2012 15:26   Dg Knee Left Port  01/07/2013   CLINICAL DATA:  Left knee replacement.  EXAM: PORTABLE LEFT KNEE - 1-2 VIEW  COMPARISON:  None.  FINDINGS: Patient status post total left knee replacement with good anatomic alignment. No acute abnormalities identified. Soft tissue air noted consistent with surgery.  IMPRESSION: Total left knee replacement with good anatomic alignment.   Electronically Signed   By: Maisie Fus  Register   On: 01/07/2013 14:56    Disposition: 01-Home or Self Care      Discharge Orders   Future Orders Complete By Expires   Call MD / Call 911  As directed    Comments:     If you experience chest pain or shortness of breath, CALL 911 and be transported to the hospital emergency room.  If you develope a fever above 101 F, pus (white drainage) or increased drainage or redness at the wound, or calf pain, call your surgeon's office.   Change dressing  As directed    Comments:     Change dressing in three days, then change the dressing daily with sterile 4 x 4 inch gauze dressing.  You may clean the incision with alcohol prior to redressing.    Constipation Prevention  As directed    Comments:     Drink plenty of fluids.  Prune juice may be helpful.  You may use a stool softener, such as Colace (over the counter) 100 mg twice a day.  Use MiraLax (over the counter) for constipation as needed.   Diet general  As directed    Discharge  instructions  As directed    Comments:     Change dressing in 3 days and reapply fresh dressing, unless you have a splint (half cast).  If you have a splint/cast, just leave in place until your follow-up appointment.    Keep wounds dry for 3 weeks.  Leave steri-strips in place on skin.  Do not apply lotion or anything to the wound.   Do not put a pillow under the knee. Place it under the heel.  As directed    TED hose  As directed    Comments:     Use stockings (TED hose) for 2 weeks on both leg(s).  You may remove them at night for sleeping.   Weight bearing as tolerated  As directed    Questions:     Laterality:     Extremity:        Follow-up Information   Follow up with Eulas Post, MD. Schedule an appointment as soon as possible for a visit in 2 weeks.   Specialty:  Orthopedic Surgery   Contact information:   [address] [place] [address] [phone]       Follow up with Elvina Sidle, MD. Schedule an appointment as soon as possible for a visit in 1 week.   Specialty:  Family Medicine   Contact information:   [address] [place] [address] [phone]        Signed: Eulas Post 01/13/2013, 4:09 PM

## 2013-01-13 NOTE — Progress Notes (Signed)
Physical Therapy Treatment Patient Details Name: Danielle Ferguson MRN: [id number] DOB: [birth year] Today's Date: 01/13/2013 Time: 4098-1191 PT Time Calculation (min): 51 min  PT Assessment / Plan / Recommendation  History of Present Illness Pt is a 62 y/o female admitted s/p L TKA.   PT Comments   Pt very self-limiting and argumentative throughout session today. Informed pt and family of MD d/c of knee immobilizer, however pt insisted on having it on at all times. Educated pt and family that immobilizer was never meant to be permanent and pt should be progressing to ambulate without it, and to continue wearing it all the time may hinder progress with knee ROM. Pt was able to negotiate 2 steps ascending and descending with assistance of 2 sons, and they report feeling comfortable helping her into the home. PT continues to recommend SNF as pt is making very slow progress with therapeutic exercise, ROM, and ambulation distance, however family wishes to take pt home today.   Follow Up Recommendations  SNF     Does the patient have the potential to tolerate intense rehabilitation     Barriers to Discharge        Equipment Recommendations  Rolling walker with 5" wheels;3in1 (PT)    Recommendations for Other Services    Frequency 7X/week   Progress towards PT Goals Progress towards PT goals: Progressing toward goals  Plan Current plan remains appropriate    Precautions / Restrictions Precautions Precautions: Knee;Fall Knee Immobilizer - Left: Other (comment) (D/C'd by MD per progress note 01/13/13) Restrictions Weight Bearing Restrictions: Yes LLE Weight Bearing: Weight bearing as tolerated   Pertinent Vitals/Pain Pt reports increase in knee pain when PT enters the room, prior to beginning any activity. Pt called for pain medication and received it promptly.     Mobility  Bed Mobility Bed Mobility: Not assessed Details for Bed Mobility Assistance: patient in recliner when PT  entered room  Transfers Transfers: Sit to Stand;Stand to Sit Sit to Stand: 4: Min guard;From chair/3-in-1;From toilet;With upper extremity assist Stand to Sit: 4: Min guard;To chair/3-in-1;To toilet;With upper extremity assist Details for Transfer Assistance: Tactile cues for hand placement on seated surface and not to pull on walker when standing.  Ambulation/Gait Ambulation/Gait Assistance: 4: Min guard Ambulation Distance (Feet): 35 Feet Assistive device: Rolling walker Ambulation/Gait Assistance Details: Tactile cueing for how far to push the walker in front of her.  Gait Pattern: Step-to pattern;Decreased stride length;Trendelenburg Gait velocity: decreased Stairs: Yes Stairs Assistance: 4: Min assist Stairs Assistance Details (indicate cue type and reason): Assist for walker placement and hand placement mostly. Tactile cueing for which leg to lead with ascending and descending. Stair Management Technique: Backwards;With walker Number of Stairs: 2 Wheelchair Mobility Wheelchair Mobility: No    Exercises Total Joint Exercises Heel Slides: 20 reps Hip ABduction/ADduction: 10 reps Goniometric ROM: 62 AAROM knee flexion   PT Diagnosis:    PT Problem List:   PT Treatment Interventions:     PT Goals (current goals can now be found in the care plan section) Acute Rehab PT Goals Patient Stated Goal: To return home PT Goal Formulation: With patient/family Time For Goal Achievement: 01/15/13 Potential to Achieve Goals: Good  Visit Information  Last PT Received On: 01/13/13 Assistance Needed: +1 History of Present Illness: Pt is a 61 y/o female admitted s/p L TKA.    Subjective Data  Subjective: Pt states she needs to use the bathroom Patient Stated Goal: To return home   Cognition  Cognition Arousal/Alertness:  Awake/alert Behavior During Therapy: WFL for tasks assessed/performed Overall Cognitive Status: Within Functional Limits for tasks assessed    Balance     End  of Session PT - End of Session Equipment Utilized During Treatment: Gait belt;Left knee immobilizer Activity Tolerance: Patient limited by lethargy Patient left: in chair;with call bell/phone within reach;with family/visitor present Nurse Communication: Mobility status   GP     Ruthann Cancer 01/13/2013, 5:01 PM  Ruthann Cancer, PT, DPT [phone]

## 2013-01-28 ENCOUNTER — Ambulatory Visit: Payer: Managed Care, Other (non HMO) | Attending: Orthopedic Surgery | Admitting: Physical Therapy

## 2013-01-28 DIAGNOSIS — Z96659 Presence of unspecified artificial knee joint: Secondary | ICD-10-CM | POA: Insufficient documentation

## 2013-01-28 DIAGNOSIS — M25569 Pain in unspecified knee: Secondary | ICD-10-CM | POA: Insufficient documentation

## 2013-01-28 DIAGNOSIS — I10 Essential (primary) hypertension: Secondary | ICD-10-CM | POA: Insufficient documentation

## 2013-01-28 DIAGNOSIS — M6281 Muscle weakness (generalized): Secondary | ICD-10-CM | POA: Insufficient documentation

## 2013-01-28 DIAGNOSIS — IMO0001 Reserved for inherently not codable concepts without codable children: Secondary | ICD-10-CM | POA: Insufficient documentation

## 2013-01-28 DIAGNOSIS — R609 Edema, unspecified: Secondary | ICD-10-CM | POA: Insufficient documentation

## 2013-01-28 DIAGNOSIS — R269 Unspecified abnormalities of gait and mobility: Secondary | ICD-10-CM | POA: Insufficient documentation

## 2013-02-03 ENCOUNTER — Ambulatory Visit: Payer: Managed Care, Other (non HMO) | Admitting: Physical Therapy

## 2013-02-04 ENCOUNTER — Ambulatory Visit: Payer: Managed Care, Other (non HMO) | Admitting: Rehabilitation

## 2013-02-06 ENCOUNTER — Ambulatory Visit (INDEPENDENT_AMBULATORY_CARE_PROVIDER_SITE_OTHER): Payer: Managed Care, Other (non HMO) | Admitting: Family Medicine

## 2013-02-06 VITALS — BP 148/76 | HR 115 | Temp 98.0°F | Resp 18 | Ht 59.0 in | Wt 206.0 lb

## 2013-02-06 DIAGNOSIS — Z79899 Other long term (current) drug therapy: Secondary | ICD-10-CM

## 2013-02-06 DIAGNOSIS — E1165 Type 2 diabetes mellitus with hyperglycemia: Principal | ICD-10-CM

## 2013-02-06 DIAGNOSIS — I10 Essential (primary) hypertension: Secondary | ICD-10-CM

## 2013-02-06 DIAGNOSIS — E119 Type 2 diabetes mellitus without complications: Secondary | ICD-10-CM

## 2013-02-06 DIAGNOSIS — E785 Hyperlipidemia, unspecified: Secondary | ICD-10-CM

## 2013-02-06 DIAGNOSIS — IMO0001 Reserved for inherently not codable concepts without codable children: Secondary | ICD-10-CM

## 2013-02-06 DIAGNOSIS — E039 Hypothyroidism, unspecified: Secondary | ICD-10-CM

## 2013-02-06 LAB — COMPREHENSIVE METABOLIC PANEL
ALT: 20 U/L (ref 0–35)
AST: 19 U/L (ref 0–37)
Albumin: 4.1 g/dL (ref 3.5–5.2)
Alkaline Phosphatase: 79 U/L (ref 39–117)
BILIRUBIN TOTAL: 0.3 mg/dL (ref 0.3–1.2)
BUN: 10 mg/dL (ref 6–23)
CO2: 27 meq/L (ref 19–32)
Calcium: 9.8 mg/dL (ref 8.4–10.5)
Chloride: 100 mEq/L (ref 96–112)
Creat: 0.66 mg/dL (ref 0.50–1.10)
Glucose, Bld: 200 mg/dL — ABNORMAL HIGH (ref 70–99)
Potassium: 4.2 mEq/L (ref 3.5–5.3)
SODIUM: 142 meq/L (ref 135–145)
TOTAL PROTEIN: 7.3 g/dL (ref 6.0–8.3)

## 2013-02-06 LAB — TSH: TSH: 1.371 u[IU]/mL (ref 0.350–4.500)

## 2013-02-06 LAB — POCT GLYCOSYLATED HEMOGLOBIN (HGB A1C): Hemoglobin A1C: 7.5

## 2013-02-06 MED ORDER — LISINOPRIL-HYDROCHLOROTHIAZIDE 20-12.5 MG PO TABS: 1.0000 | ORAL_TABLET | Freq: Every day | ORAL | Status: DC

## 2013-02-06 MED ORDER — SIMVASTATIN 40 MG PO TABS: 40.0000 mg | ORAL_TABLET | Freq: Every day | ORAL | Status: DC

## 2013-02-06 MED ORDER — INSULIN GLARGINE 100 UNIT/ML SOLOSTAR PEN: PEN_INJECTOR | SUBCUTANEOUS | Status: DC

## 2013-02-06 MED ORDER — METFORMIN HCL 1000 MG PO TABS: 1000.0000 mg | ORAL_TABLET | Freq: Two times a day (BID) | ORAL | Status: DC

## 2013-02-06 MED ORDER — GLIMEPIRIDE 2 MG PO TABS: 2.0000 mg | ORAL_TABLET | Freq: Every day | ORAL | Status: DC

## 2013-02-06 NOTE — Progress Notes (Addendum)
Subjective:    Patient ID: Danielle Ferguson, female    DOB: 1951-01-15, 63 y.o.   MRN: [id number] Chief Complaint  Patient presents with  . rx refills    levonox, oxycodonne, methocarbamol, lisinopril, metformin, lantus, simvastatin, synthroid, amaryl    HPI This chart was scribed for Carley Hammed Shaw-MD by Smiley Houseman, Scribe. This patient was seen in room 11 and the patient's care was started at 1:09 PM.  HPI Comments: Danielle Ferguson is a 63 y.o. female who presents to the [facility] for medication refills. She is here w/ her son who translates for [place] (and her infant granddaughter). She checks her sugars once a day in the morning.  She can tell when her cbgs are high as she urinates more frequently and has dry mouth. She felt like this last night so she checked her cbgs and it was 210.  She states that when her sugars are low she feels dizzy and shakey.  She reports this occurs when her sugar is 90.  She states that the lowest sugar in the past month was 60 and that only happened once - tends to have more hypoglycemic sxs in the morning.  Pt reports that since her knee surgery 1 mo prev her sugars have been fluctuating more.  Pt states that she had milk in her tea, cream and bread, and peanut butter and jelly for breakfast today.  Her son states that she usually eats eggs for breakfast.  Her Hemaglobin A1C was 9.3 three months ago.  She is taking metformin 1000 mg bid, Lantus 34 units in the morning and 24 units at bed time, and amaryl 2mg  qam.  She takes simvastatin 40 mg for her high cholesterol.  Pt's lipid panel was last done 3 months prior and was at goal.  Tsh was border line elevated around 4.5 and levothyroxine dose not changed.  Pt is taking lisinopril-hctz 20-12.5 daily for her HTN for a long time.  BP in hosp was perfect.   Pt recently had a knee replacement one month prior.  Pt states that the last lovenox injection she took was yesterday.  She has an appt w/ Dr.  Dion Saucier next wk to f/u on her knee replacement 1 mo prev.  She does not know how long she is supposed to be on the lovenox. She still needs the pain medicine - on oxycodone 10.  Past Surgical History  Procedure Laterality Date  . Abdominal surgery    . Total knee arthroplasty Left 01/07/2013    Procedure: TOTAL KNEE ARTHROPLASTY;  Surgeon: Eulas Post, MD;  Location: [facility] OR;  Service: Orthopedics;  Laterality: Left;    History reviewed. No pertinent family history.  History   Social History  . Marital Status: Widowed    Spouse Name: N/A    Number of Children: N/A  . Years of Education: N/A   Occupational History  . Not on file.   Social History Main Topics  . Smoking status: Never Smoker   . Smokeless tobacco: Not on file  . Alcohol Use: No  . Drug Use: No  . Sexual Activity: Not on file   Other Topics Concern  . Not on file   Social History Narrative  . No narrative on file    No Known Allergies  Patient Active Problem List   Diagnosis Date Noted  . HTN (hypertension) 01/10/2013  . Knee osteoarthritis 01/07/2013  . Osteoarthritis of left knee 12/27/2012  . Osteoarthritis of right  knee 12/27/2012  . Type II or unspecified type diabetes mellitus without mention of complication, uncontrolled 11/03/2012  . Unspecified hypothyroidism 11/03/2012    Review of Systems  Constitutional: Negative for fever and chills.  HENT: Negative for congestion, rhinorrhea and sore throat.   Respiratory: Negative for cough and shortness of breath.   Cardiovascular: Negative for chest pain.  Gastrointestinal: Positive for abdominal pain. Negative for nausea, vomiting and diarrhea.  Genitourinary: Negative for flank pain.  Musculoskeletal: Positive for arthralgias, gait problem and joint swelling.  Skin: Negative for color change and rash.  Neurological: Negative for syncope and headaches.   Triage Vitals: BP 146/60  Pulse 115  Temp(Src) 98 F (36.7 C) (Oral)  Resp 18  Ht 4'  11" (1.499 m)  Wt 206 lb (93.441 kg)  BMI 41.58 kg/m2  SpO2 98% Objective:   Physical Exam  Nursing note and vitals reviewed. Constitutional: She is oriented to person, place, and time. She appears well-developed and well-nourished. No distress.  HENT:  Head: Normocephalic and atraumatic.  Eyes: Conjunctivae and EOM are normal. Right eye exhibits no discharge. Left eye exhibits no discharge.  Neck: Neck supple. No tracheal deviation present. No thyromegaly present.  Cardiovascular: Normal rate, regular rhythm and normal heart sounds.   No murmur heard. Pulmonary/Chest: Effort normal and breath sounds normal. No respiratory distress. She has no wheezes. She has no rales.  Musculoskeletal: Normal range of motion.  Lymphadenopathy:       Head (right side): No submandibular, no tonsillar, no preauricular, no posterior auricular and no occipital adenopathy present.       Head (left side): No submandibular, no tonsillar, no preauricular, no posterior auricular and no occipital adenopathy present.    She has no cervical adenopathy.       Right: No supraclavicular adenopathy present.       Left: No supraclavicular adenopathy present.  Neurological: She is alert and oriented to person, place, and time. Gait abnormal.  Using rolling walker  Skin: Skin is warm and dry.  Psychiatric: She has a normal mood and affect. Her behavior is normal.      Results for orders placed in visit on 02/06/13  POCT GLYCOSYLATED HEMOGLOBIN (HGB A1C)      Result Value Range   Hemoglobin A1C 7.5     Assessment & Plan:  Type II or unspecified type diabetes mellitus without mention of complication, uncontrolled - Plan: POCT glycosylated hemoglobin (Hb A1C), Comprehensive metabolic panel, Microalbumin / creatinine urine ratio, Ambulatory referral to Ophthalmology - DM sig improved since last OV - at goal for pt.  Cont on current regimen - she is reporting hypoglycemic sxs at nml cbgs so hopefully this will stop. Only  1 episode of actual hypoglycemia - cbg 60 - once - but if cont may want to stop amaryl.  Needs optho exam.  Needs foot exam at f/u.  I don't think pt understands a DM diet at all - could benefit from nutritionist/DM ed but would be difficult - need 1 on 1 - since does not speak [place].  Recheck DM in 3 mos - sched OV for this.  Hypothyroidism - Plan: TSH - refill levothyroxine after labs return as TSH mildly elev at prior visit.  HTN (hypertension) - mildly elev today but well controlled in the hosp. Recheck at f/u - if still elev cons increasing lisinopril/hctz to 20/25.  Other and unspecified hyperlipidemia - at goal on simvastatin 40 on lipid panel 3 mos prior.  Encounter for long-term (  current) use of other medications  Total knee arthroplasty 1 mo prior - Pt has been on IM lovenox for the past mo - reports she took her last dose yest - unsure how long she is supposed to be on this - rec call Dr. Shelba Flake office to confirm. Pain meds - methocarbamol and oxycodone - rx'ed prev by ortho - f/u w/ ortho for these refills.   Bilateral lower abd/pelvic pain - Pt mentioned this at the very end of the visit as she was leaving - has had x 6 wks, has never had prior pelvic. Needs UA, pelvic, and poss from constipation from pain meds - though was rx'ed senokot S. Rec RTC w/in 1 wk for further eval - sooner if worsening.  Meds ordered this encounter  Medications  . metFORMIN (GLUCOPHAGE) 1000 MG tablet    Sig: Take 1 tablet (1,000 mg total) by mouth 2 (two) times daily with a meal.    Dispense:  60 tablet    Refill:  3  . simvastatin (ZOCOR) 40 MG tablet    Sig: Take 1 tablet (40 mg total) by mouth daily.    Dispense:  30 tablet    Refill:  1  . DISCONTD: Insulin Glargine (LANTUS SOLOSTAR) 100 UNIT/ML Solostar Pen    Sig: Take 34 units in the morning and 24 units at bedtime.    Dispense:  15 mL    Refill:  2  . glimepiride (AMARYL) 2 MG tablet    Sig: Take 1 tablet (2 mg total) by mouth daily  before breakfast.    Dispense:  30 tablet    Refill:  2  . lisinopril-hydrochlorothiazide (PRINZIDE,ZESTORETIC) 20-12.5 MG per tablet    Sig: Take 1 tablet by mouth daily. PATIENT NEEDS OFFICE VISIT FOR ADDITIONAL REFILLS    Dispense:  30 tablet    Refill:  3  . Insulin Glargine (LANTUS SOLOSTAR) 100 UNIT/ML Solostar Pen    Sig: Take 34 units in the morning and 24 units at bedtime.    Dispense:  15 mL    Refill:  2    I personally performed the services described in this documentation, which was scribed in my presence. The recorded information has been reviewed and considered, and addended by me as needed.  Norberto Sorenson, MD MPH

## 2013-02-06 NOTE — Patient Instructions (Addendum)
Call Dr. Shelba FlakeLandau's office [phone] whether he wants you to continue on your lovenox injections.  You will also need to get your pain medication - oxycodone and methocarbamol - from him. If it important only to get these medications from one provider.  Diabetes Meal Planning Guide The diabetes meal planning guide is a tool to help you plan your meals and snacks. It is important for people with diabetes to manage their blood glucose (sugar) levels. Choosing the right foods and the right amounts throughout your day will help control your blood glucose. Eating right can even help you improve your blood pressure and reach or maintain a healthy weight. CARBOHYDRATE COUNTING MADE EASY When you eat carbohydrates, they turn to sugar. This raises your blood glucose level. Counting carbohydrates can help you control this level so you feel better. When you plan your meals by counting carbohydrates, you can have more flexibility in what you eat and balance your medicine with your food intake. Carbohydrate counting simply means adding up the total amount of carbohydrate grams in your meals and snacks. Try to eat about the same amount at each meal. Foods with carbohydrates are listed below. Each portion below is 1 carbohydrate serving or 15 grams of carbohydrates. Ask your dietician how many grams of carbohydrates you should eat at each meal or snack. Grains and Starches  1 slice bread.   English muffin or hotdog/hamburger bun.   cup cold cereal (unsweetened).   cup cooked pasta or rice.   cup starchy vegetables (corn, potatoes, peas, beans, winter squash).  1 tortilla (6 inches).   bagel.  1 waffle or pancake (size of a CD).   cup cooked cereal.  4 to 6 small crackers. *Whole grain is recommended. Fruit  1 cup fresh unsweetened berries, melon, papaya, pineapple.  1 small fresh fruit.   banana or mango.   cup fruit juice (4 oz unsweetened).   cup canned fruit in natural juice or  water.  2 tbs dried fruit.  12 to 15 grapes or cherries. Milk and Yogurt  1 cup fat-free or 1% milk.  1 cup soy milk.  6 oz light yogurt with sugar-free sweetener.  6 oz low-fat soy yogurt.  6 oz plain yogurt. Vegetables  1 cup raw or  cup cooked is counted as 0 carbohydrates or a "free" food.  If you eat 3 or more servings at 1 meal, count them as 1 carbohydrate serving. Other Carbohydrates   oz chips or pretzels.   cup ice cream or frozen yogurt.   cup sherbet or sorbet.  2 inch square cake, no frosting.  1 tbs honey, sugar, jam, jelly, or syrup.  2 small cookies.  3 squares of graham crackers.  3 cups popcorn.  6 crackers.  1 cup broth-based soup.  Count 1 cup casserole or other mixed foods as 2 carbohydrate servings.  Foods with less than 20 calories in a serving may be counted as 0 carbohydrates or a "free" food. You may want to purchase a book or computer software that lists the carbohydrate gram counts of different foods. In addition, the nutrition facts panel on the labels of the foods you eat are a good source of this information. The label will tell you how big the serving size is and the total number of carbohydrate grams you will be eating per serving. Divide this number by 15 to obtain the number of carbohydrate servings in a portion. Remember, 1 carbohydrate serving equals 15 grams of carbohydrate.  SERVING SIZES Measuring foods and serving sizes helps you make sure you are getting the right amount of food. The list below tells how big or small some common serving sizes are.  1 oz.........4 stacked dice.  3 oz........Marland KitchenDeck of cards.  1 tsp.......Marland KitchenTip of little finger.  1 tbs......Marland KitchenMarland KitchenThumb.  2 tbs.......Marland KitchenGolf ball.   cup......Marland KitchenHalf of a fist.  1 cup.......Marland KitchenA fist. SAMPLE DIABETES MEAL PLAN Below is a sample meal plan that includes foods from the grain and starches, dairy, vegetable, fruit, and meat groups. A dietician can individualize a  meal plan to fit your calorie needs and tell you the number of servings needed from each food group. However, controlling the total amount of carbohydrates in your meal or snack is more important than making sure you include all of the food groups at every meal. You may interchange carbohydrate containing foods (dairy, starches, and fruits). The meal plan below is an example of a 2000 calorie diet using carbohydrate counting. This meal plan has 17 carbohydrate servings. Breakfast  1 cup oatmeal (2 carb servings).   cup light yogurt (1 carb serving).  1 cup blueberries (1 carb serving).   cup almonds. Snack  1 large apple (2 carb servings).  1 low-fat string cheese stick. Lunch  Chicken breast salad.  1 cup spinach.   cup chopped tomatoes.  2 oz chicken breast, sliced.  2 tbs low-fat [place] dressing.  12 whole-wheat crackers (2 carb servings).  12 to 15 grapes (1 carb serving).  1 cup low-fat milk (1 carb serving). Snack  1 cup carrots.   cup hummus (1 carb serving). Dinner  3 oz broiled salmon.  1 cup brown rice (3 carb servings). Snack  1  cups steamed broccoli (1 carb serving) drizzled with 1 tsp olive oil and lemon juice.  1 cup light pudding (2 carb servings). DIABETES MEAL PLANNING WORKSHEET Your dietician can use this worksheet to help you decide how many servings of foods and what types of foods are right for you.  BREAKFAST Food Group and Servings / Carb Servings Grain/Starches __________________________________ Dairy __________________________________________ Vegetable ______________________________________ Fruit ___________________________________________ Meat __________________________________________ Fat ____________________________________________ LUNCH Food Group and Servings / Carb Servings Grain/Starches ___________________________________ Dairy ___________________________________________ Fruit  ____________________________________________ Meat ___________________________________________ Fat _____________________________________________ Laural Golden Food Group and Servings / Carb Servings Grain/Starches ___________________________________ Dairy ___________________________________________ Fruit ____________________________________________ Meat ___________________________________________ Fat _____________________________________________ SNACKS Food Group and Servings / Carb Servings Grain/Starches ___________________________________ Dairy ___________________________________________ Vegetable _______________________________________ Fruit ____________________________________________ Meat ___________________________________________ Fat _____________________________________________ DAILY TOTALS Starches _________________________ Vegetable ________________________ Fruit ____________________________ Dairy ____________________________ Meat ____________________________ Fat ______________________________ Document Released: 09/29/2004 Document Revised: 03/27/2011 Document Reviewed: 08/10/2008 [employer] Patient Information 2014 [employer]

## 2013-02-07 LAB — MICROALBUMIN / CREATININE URINE RATIO
Creatinine, Urine: 96.1 mg/dL
MICROALB UR: 0.97 mg/dL (ref 0.00–1.89)
Microalb Creat Ratio: 10.1 mg/g (ref 0.0–30.0)

## 2013-02-09 ENCOUNTER — Encounter: Payer: Self-pay | Admitting: Family Medicine

## 2013-02-09 MED ORDER — LEVOTHYROXINE SODIUM 100 MCG PO TABS: ORAL_TABLET | ORAL | Status: DC

## 2013-02-09 NOTE — Addendum Note (Signed)
Addended by: Norberto SorensonSHAW, Cindra Austad on: 02/09/2013 03:21 PM   Modules accepted: Orders

## 2013-02-10 ENCOUNTER — Ambulatory Visit: Payer: Managed Care, Other (non HMO) | Admitting: Physical Therapy

## 2013-02-11 ENCOUNTER — Ambulatory Visit: Payer: Managed Care, Other (non HMO) | Admitting: Physical Therapy

## 2013-02-13 ENCOUNTER — Ambulatory Visit: Payer: Managed Care, Other (non HMO) | Admitting: Physical Therapy

## 2013-02-17 ENCOUNTER — Ambulatory Visit: Payer: Managed Care, Other (non HMO) | Attending: Orthopedic Surgery | Admitting: Physical Therapy

## 2013-02-17 DIAGNOSIS — Z96659 Presence of unspecified artificial knee joint: Secondary | ICD-10-CM | POA: Insufficient documentation

## 2013-02-17 DIAGNOSIS — IMO0001 Reserved for inherently not codable concepts without codable children: Secondary | ICD-10-CM | POA: Insufficient documentation

## 2013-02-17 DIAGNOSIS — M25569 Pain in unspecified knee: Secondary | ICD-10-CM | POA: Insufficient documentation

## 2013-02-17 DIAGNOSIS — R269 Unspecified abnormalities of gait and mobility: Secondary | ICD-10-CM | POA: Insufficient documentation

## 2013-02-17 DIAGNOSIS — R609 Edema, unspecified: Secondary | ICD-10-CM | POA: Insufficient documentation

## 2013-02-17 DIAGNOSIS — I10 Essential (primary) hypertension: Secondary | ICD-10-CM | POA: Insufficient documentation

## 2013-02-17 DIAGNOSIS — M6281 Muscle weakness (generalized): Secondary | ICD-10-CM | POA: Insufficient documentation

## 2013-02-18 ENCOUNTER — Ambulatory Visit: Payer: Managed Care, Other (non HMO) | Admitting: Rehabilitation

## 2013-02-20 ENCOUNTER — Ambulatory Visit: Payer: Managed Care, Other (non HMO) | Admitting: Physical Therapy

## 2013-02-24 ENCOUNTER — Ambulatory Visit: Payer: Managed Care, Other (non HMO) | Admitting: Rehabilitation

## 2013-02-26 ENCOUNTER — Ambulatory Visit: Payer: Managed Care, Other (non HMO) | Admitting: Rehabilitation

## 2013-02-27 ENCOUNTER — Ambulatory Visit: Payer: Managed Care, Other (non HMO)

## 2013-03-03 ENCOUNTER — Ambulatory Visit: Payer: Managed Care, Other (non HMO) | Admitting: Rehabilitation

## 2013-03-05 ENCOUNTER — Ambulatory Visit: Payer: Managed Care, Other (non HMO) | Admitting: Rehabilitation

## 2013-03-06 ENCOUNTER — Ambulatory Visit: Payer: Managed Care, Other (non HMO) | Admitting: Rehabilitation

## 2013-03-13 ENCOUNTER — Other Ambulatory Visit: Payer: Self-pay | Admitting: Orthopedic Surgery

## 2013-03-20 ENCOUNTER — Ambulatory Visit: Payer: Managed Care, Other (non HMO) | Attending: Orthopedic Surgery | Admitting: Rehabilitation

## 2013-03-20 DIAGNOSIS — M25569 Pain in unspecified knee: Secondary | ICD-10-CM | POA: Insufficient documentation

## 2013-03-20 DIAGNOSIS — IMO0001 Reserved for inherently not codable concepts without codable children: Secondary | ICD-10-CM | POA: Insufficient documentation

## 2013-03-20 DIAGNOSIS — I10 Essential (primary) hypertension: Secondary | ICD-10-CM | POA: Insufficient documentation

## 2013-03-20 DIAGNOSIS — M6281 Muscle weakness (generalized): Secondary | ICD-10-CM | POA: Insufficient documentation

## 2013-03-20 DIAGNOSIS — Z96659 Presence of unspecified artificial knee joint: Secondary | ICD-10-CM | POA: Insufficient documentation

## 2013-03-20 DIAGNOSIS — R609 Edema, unspecified: Secondary | ICD-10-CM | POA: Insufficient documentation

## 2013-03-20 DIAGNOSIS — R269 Unspecified abnormalities of gait and mobility: Secondary | ICD-10-CM | POA: Insufficient documentation

## 2013-03-25 ENCOUNTER — Ambulatory Visit: Payer: Managed Care, Other (non HMO) | Admitting: Physical Therapy

## 2013-04-02 ENCOUNTER — Encounter (HOSPITAL_COMMUNITY): Payer: Self-pay

## 2013-04-02 ENCOUNTER — Other Ambulatory Visit (HOSPITAL_COMMUNITY): Payer: Self-pay | Admitting: *Deleted

## 2013-04-02 ENCOUNTER — Encounter (HOSPITAL_COMMUNITY)
Admission: RE | Admit: 2013-04-02 | Discharge: 2013-04-02 | Disposition: A | Discharge status: Home / Self Care | Payer: Managed Care, Other (non HMO) | Source: Ambulatory Visit | Attending: Otolaryngology | Admitting: Otolaryngology

## 2013-04-02 DIAGNOSIS — Z01812 Encounter for preprocedural laboratory examination: Secondary | ICD-10-CM | POA: Insufficient documentation

## 2013-04-02 HISTORY — DX: Other specified postprocedural states: Z98.890

## 2013-04-02 HISTORY — DX: Other specified postprocedural states: R11.2

## 2013-04-02 LAB — TYPE AND SCREEN
ABO/RH(D): B POS
Antibody Screen: NEGATIVE

## 2013-04-02 LAB — BASIC METABOLIC PANEL
BUN: 16 mg/dL (ref 6–23)
CO2: 24 meq/L (ref 19–32)
Calcium: 9.4 mg/dL (ref 8.4–10.5)
Chloride: 98 mEq/L (ref 96–112)
Creatinine, Ser: 0.58 mg/dL (ref 0.50–1.10)
GFR calc Af Amer: >90 mL/min (ref >90)
GFR calc non Af Amer: >90 mL/min (ref >90)
GLUCOSE: 274 mg/dL — AB (ref 70–99)
Potassium: 3.9 mEq/L (ref 3.7–5.3)
SODIUM: 137 meq/L (ref 137–147)

## 2013-04-02 LAB — SURGICAL PCR SCREEN
MRSA, PCR: NEGATIVE
STAPHYLOCOCCUS AUREUS: NEGATIVE

## 2013-04-02 LAB — PROTIME-INR
INR: 1.04 (ref 0.00–1.49)
PROTHROMBIN TIME: 13.4 s (ref 11.6–15.2)

## 2013-04-02 LAB — CBC
HCT: 40.8 % (ref 36.0–46.0)
HEMOGLOBIN: 13.9 g/dL (ref 12.0–15.0)
MCH: 27.9 pg (ref 26.0–34.0)
MCHC: 34.1 g/dL (ref 30.0–36.0)
MCV: 81.9 fL (ref 78.0–100.0)
Platelets: 215 10*3/uL (ref 150–400)
RBC: 4.98 MIL/uL (ref 3.87–5.11)
RDW: 13.7 % (ref 11.5–15.5)
WBC: 9.5 10*3/uL (ref 4.0–10.5)

## 2013-04-02 LAB — APTT: APTT: 24 s (ref 24–37)

## 2013-04-02 NOTE — Addendum Note (Signed)
Addended by: Winifred OliveRABTREE, Deztiny Sarra W on: 04/02/2013 09:14 AM   Modules accepted: Orders

## 2013-04-02 NOTE — Pre-Procedure Instructions (Signed)
Danielle Ferguson  04/02/2013   Your procedure is scheduled on:  Tuesday, April 08, 2013 at 7:30 AM.   Report to [facility] Entrance "A" Admitting Office at 5:30 AM.   Call this number if you have problems the morning of surgery: [phone]   Remember:   Do not eat food or drink liquids after midnight Monday, 04/07/13.   Take these medicines the morning of surgery with A SIP OF WATER: levothyroxine (SYNTHROID, LEVOTHROID), docusate sodium (COLACE),  oxyCODONE-acetaminophen (PERCOCET) - if needed, promethazine (PHENERGAN) - if needed, methocarbamol (ROBAXIN) - if needed.  Stop Aspirin as of today. Follow physician's instructions for Lovenox. Do NOT take any of your diabetic medications the morning of surgery.   Do not wear jewelry, make-up or nail polish.  Do not wear lotions, powders, or perfumes. You may wear deodorant.  Do not shave 48 hours prior to surgery.   Do not bring valuables to the hospital.  [facility] is not responsible                  for any belongings or valuables.               Contacts, dentures or bridgework may not be worn into surgery.  Leave suitcase in the car. After surgery it may be brought to your room.  For patients admitted to the hospital, discharge time is determined by your                treatment team.             Special Instructions: [facility] - Preparing for Surgery  Before surgery, you can play an important role.  Because skin is not sterile, your skin needs to be as free of germs as possible.  You can reduce the number of germs on you skin by washing with CHG (chlorahexidine gluconate) soap before surgery.  CHG is an antiseptic cleaner which kills germs and bonds with the skin to continue killing germs even after washing.  Please DO NOT use if you have an allergy to CHG or antibacterial soaps.  If your skin becomes reddened/irritated stop using the CHG and inform your nurse when you arrive at Short Stay.  Do not shave (including legs  and underarms) for at least 48 hours prior to the first CHG shower.  You may shave your face.  Please follow these instructions carefully:   1.  Shower with CHG Soap the night before surgery and the                                morning of Surgery.  2.  If you choose to wash your hair, wash your hair first as usual with your       normal shampoo.  3.  After you shampoo, rinse your hair and body thoroughly to remove the                      Shampoo.  4.  Use CHG as you would any other liquid soap.  You can apply chg directly       to the skin and wash gently with scrungie or a clean washcloth.  5.  Apply the CHG Soap to your body ONLY FROM THE NECK DOWN.        Do not use on open wounds or open sores.  Avoid contact with your eyes, ears, mouth and  genitals (private parts).  Wash genitals (private parts) with your normal soap.  6.  Wash thoroughly, paying special attention to the area where your surgery        will be performed.  7.  Thoroughly rinse your body with warm water from the neck down.  8.  DO NOT shower/wash with your normal soap after using and rinsing off       the CHG Soap.  9.  Pat yourself dry with a clean towel.            10.  Wear clean pajamas.            11.  Place clean sheets on your bed the night of your first shower and do not        sleep with pets.  Day of Surgery  Do not apply any lotions the morning of surgery.  Please wear clean clothes to the hospital/surgery center.     Please read over the following fact sheets that you were given: Pain Booklet, Coughing and Deep Breathing, Blood Transfusion Information, MRSA Information and Surgical Site Infection Prevention

## 2013-04-03 NOTE — Progress Notes (Signed)
Anesthesia Chart Review: Patient is a 63 year old female scheduled for right TKA on 04/08/13 by Dr. Dion SaucierLandau.   History includes DM2, HTN, hypercholesterolemia, hypothyroidism, headaches, non-smoker, post-operative N/V, chronic DOE, left TKA 01/07/13. BMI is 43 consistent with morbid obesity. [facility]   EKG on 12/31/12 showed NSR (poor r wave progression in V4-5).   CXR on 12/31/12 showed no active cardiopulmonary disease.   Preoperative labs noted. Glucose was 274. (A1C on 02/06/13 was 7.5 down from 9.3 on 10/25/12.)  She will get a fasting glucose on arrival--if reasonable and no other acute changes then I anticipate that she can proceed as planned.  Velna Ochsllison Matvey Llanas, PA-C [facility] Phone [phone] 04/03/2013 11:10 AM

## 2013-04-05 ENCOUNTER — Other Ambulatory Visit: Payer: Self-pay | Admitting: Family Medicine

## 2013-04-07 MED ORDER — CEFAZOLIN SODIUM-DEXTROSE 2-3 GM-% IV SOLR
2.0000 g | INTRAVENOUS | Status: AC
  Administered 2013-04-08: 2 g via INTRAVENOUS
  Filled 2013-04-07: qty 50

## 2013-04-08 ENCOUNTER — Inpatient Hospital Stay (HOSPITAL_COMMUNITY): Payer: Managed Care, Other (non HMO) | Admitting: Anesthesiology

## 2013-04-08 ENCOUNTER — Encounter (HOSPITAL_COMMUNITY): Payer: Self-pay | Admitting: *Deleted

## 2013-04-08 ENCOUNTER — Encounter (HOSPITAL_COMMUNITY)
Admission: RE | Disposition: A | Discharge status: Home Health | Payer: Self-pay | Source: Ambulatory Visit | Attending: Orthopedic Surgery

## 2013-04-08 ENCOUNTER — Inpatient Hospital Stay (HOSPITAL_COMMUNITY)
Admission: RE | Admit: 2013-04-08 | Discharge: 2013-04-12 | DRG: 470 | Disposition: A | Discharge status: Home Health | Payer: Managed Care, Other (non HMO) | Source: Ambulatory Visit | Attending: Orthopedic Surgery | Admitting: Orthopedic Surgery

## 2013-04-08 ENCOUNTER — Encounter (HOSPITAL_COMMUNITY): Payer: Managed Care, Other (non HMO) | Admitting: Vascular Surgery

## 2013-04-08 ENCOUNTER — Inpatient Hospital Stay (HOSPITAL_COMMUNITY): Payer: Managed Care, Other (non HMO)

## 2013-04-08 DIAGNOSIS — M1710 Unilateral primary osteoarthritis, unspecified knee: Principal | ICD-10-CM | POA: Diagnosis present

## 2013-04-08 DIAGNOSIS — M1711 Unilateral primary osteoarthritis, right knee: Secondary | ICD-10-CM | POA: Diagnosis present

## 2013-04-08 DIAGNOSIS — Z794 Long term (current) use of insulin: Secondary | ICD-10-CM

## 2013-04-08 DIAGNOSIS — D62 Acute posthemorrhagic anemia: Secondary | ICD-10-CM | POA: Diagnosis not present

## 2013-04-08 DIAGNOSIS — E119 Type 2 diabetes mellitus without complications: Secondary | ICD-10-CM | POA: Diagnosis present

## 2013-04-08 DIAGNOSIS — M179 Osteoarthritis of knee, unspecified: Secondary | ICD-10-CM | POA: Diagnosis present

## 2013-04-08 DIAGNOSIS — I10 Essential (primary) hypertension: Secondary | ICD-10-CM | POA: Diagnosis present

## 2013-04-08 HISTORY — DX: Other chronic pain: G89.29

## 2013-04-08 HISTORY — DX: Type 2 diabetes mellitus without complications: E11.9

## 2013-04-08 HISTORY — DX: Unspecified osteoarthritis, unspecified site: M19.90

## 2013-04-08 HISTORY — DX: Radiculopathy, lumbar region: M54.16

## 2013-04-08 HISTORY — PX: TOTAL KNEE ARTHROPLASTY: SHX125

## 2013-04-08 LAB — CBC
HEMATOCRIT: 34.9 % — AB (ref 36.0–46.0)
HEMOGLOBIN: 12 g/dL (ref 12.0–15.0)
MCH: 28.1 pg (ref 26.0–34.0)
MCHC: 34.4 g/dL (ref 30.0–36.0)
MCV: 81.7 fL (ref 78.0–100.0)
Platelets: 191 10*3/uL (ref 150–400)
RBC: 4.27 MIL/uL (ref 3.87–5.11)
RDW: 13.8 % (ref 11.5–15.5)
WBC: 13.6 10*3/uL — ABNORMAL HIGH (ref 4.0–10.5)

## 2013-04-08 LAB — CREATININE, SERUM: Creatinine, Ser: 0.65 mg/dL (ref 0.50–1.10)

## 2013-04-08 LAB — GLUCOSE, CAPILLARY
GLUCOSE-CAPILLARY: 285 mg/dL — AB (ref 70–99)
GLUCOSE-CAPILLARY: 233 mg/dL — AB (ref 70–99)
Glucose-Capillary: 207 mg/dL — ABNORMAL HIGH (ref 70–99)
Glucose-Capillary: 292 mg/dL — ABNORMAL HIGH (ref 70–99)

## 2013-04-08 SURGERY — ARTHROPLASTY, KNEE, TOTAL
Anesthesia: General | Site: Knee | Laterality: Right

## 2013-04-08 MED ORDER — FENTANYL CITRATE 0.05 MG/ML IJ SOLN
INTRAMUSCULAR | Status: AC
  Filled 2013-04-08: qty 5

## 2013-04-08 MED ORDER — NEOSTIGMINE METHYLSULFATE 1 MG/ML IJ SOLN
INTRAMUSCULAR | Status: AC
  Filled 2013-04-08: qty 10

## 2013-04-08 MED ORDER — PROPOFOL 10 MG/ML IV BOLUS
INTRAVENOUS | Status: DC | PRN
  Administered 2013-04-08: 120 mg via INTRAVENOUS

## 2013-04-08 MED ORDER — PHENYLEPHRINE HCL 10 MG/ML IJ SOLN
INTRAMUSCULAR | Status: DC | PRN
Start: 2013-04-08 — End: 2013-04-08
  Administered 2013-04-08: 80 ug via INTRAVENOUS
  Administered 2013-04-08: 120 ug via INTRAVENOUS
  Administered 2013-04-08: 40 ug via INTRAVENOUS
  Administered 2013-04-08: 200 ug via INTRAVENOUS

## 2013-04-08 MED ORDER — HYDROMORPHONE HCL PF 1 MG/ML IJ SOLN
0.2500 mg | INTRAMUSCULAR | Status: DC | PRN
  Administered 2013-04-08 (×3): 0.5 mg via INTRAVENOUS

## 2013-04-08 MED ORDER — SODIUM CHLORIDE 0.9 % IR SOLN
Status: DC | PRN
  Administered 2013-04-08: 3000 mL

## 2013-04-08 MED ORDER — OXYCODONE HCL 5 MG PO TABS: 5.0000 mg | ORAL_TABLET | Freq: Once | ORAL | Status: DC | PRN

## 2013-04-08 MED ORDER — MAGNESIUM CITRATE PO SOLN: 1.0000 | Freq: Once | ORAL | Status: AC | PRN

## 2013-04-08 MED ORDER — HYDROCHLOROTHIAZIDE 12.5 MG PO CAPS
12.5000 mg | ORAL_CAPSULE | Freq: Every day | ORAL | Status: DC
  Administered 2013-04-08 – 2013-04-12 (×5): 12.5 mg via ORAL
  Filled 2013-04-08 (×5): qty 1

## 2013-04-08 MED ORDER — CEFAZOLIN SODIUM-DEXTROSE 2-3 GM-% IV SOLR
2.0000 g | Freq: Four times a day (QID) | INTRAVENOUS | Status: AC
  Administered 2013-04-08 (×2): 2 g via INTRAVENOUS
  Filled 2013-04-08 (×2): qty 50

## 2013-04-08 MED ORDER — METHOCARBAMOL 500 MG PO TABS: 500.0000 mg | ORAL_TABLET | Freq: Four times a day (QID) | ORAL | Status: DC

## 2013-04-08 MED ORDER — SODIUM CHLORIDE 0.9 % IJ SOLN
INTRAMUSCULAR | Status: AC
  Filled 2013-04-08: qty 10

## 2013-04-08 MED ORDER — ONDANSETRON HCL 4 MG/2ML IJ SOLN: INTRAMUSCULAR | Status: DC | PRN

## 2013-04-08 MED ORDER — MEPERIDINE HCL 25 MG/ML IJ SOLN: 6.2500 mg | INTRAMUSCULAR | Status: DC | PRN

## 2013-04-08 MED ORDER — ROCURONIUM BROMIDE 50 MG/5ML IV SOLN
INTRAVENOUS | Status: AC
Start: 2013-04-08 — End: 2013-04-08
  Filled 2013-04-08: qty 1

## 2013-04-08 MED ORDER — ONDANSETRON HCL 4 MG/2ML IJ SOLN
INTRAMUSCULAR | Status: DC | PRN
  Administered 2013-04-08: 4 mg via INTRAVENOUS

## 2013-04-08 MED ORDER — ACETAMINOPHEN 325 MG PO TABS
650.0000 mg | ORAL_TABLET | Freq: Four times a day (QID) | ORAL | Status: DC | PRN
  Administered 2013-04-10: 650 mg via ORAL
  Filled 2013-04-08: qty 2

## 2013-04-08 MED ORDER — OXYCODONE HCL 5 MG/5ML PO SOLN: 5.0000 mg | Freq: Once | ORAL | Status: DC | PRN

## 2013-04-08 MED ORDER — DEXAMETHASONE 6 MG PO TABS
10.0000 mg | ORAL_TABLET | Freq: Three times a day (TID) | ORAL | Status: AC
  Administered 2013-04-08 (×2): 10 mg via ORAL
  Filled 2013-04-08 (×3): qty 1

## 2013-04-08 MED ORDER — BISACODYL 10 MG RE SUPP: 10.0000 mg | Freq: Every day | RECTAL | Status: DC | PRN

## 2013-04-08 MED ORDER — ACETAMINOPHEN 650 MG RE SUPP: 650.0000 mg | Freq: Four times a day (QID) | RECTAL | Status: DC | PRN

## 2013-04-08 MED ORDER — HYDROMORPHONE HCL PF 1 MG/ML IJ SOLN
INTRAMUSCULAR | Status: AC
  Filled 2013-04-08: qty 1

## 2013-04-08 MED ORDER — METHOCARBAMOL 100 MG/ML IJ SOLN: 500.0000 mg | Freq: Four times a day (QID) | INTRAVENOUS | Status: DC | PRN

## 2013-04-08 MED ORDER — ASPIRIN EC 81 MG PO TBEC
81.0000 mg | DELAYED_RELEASE_TABLET | Freq: Every day | ORAL | Status: DC
  Administered 2013-04-08 – 2013-04-12 (×5): 81 mg via ORAL
  Filled 2013-04-08 (×5): qty 1

## 2013-04-08 MED ORDER — SENNA-DOCUSATE SODIUM 8.6-50 MG PO TABS
2.0000 | ORAL_TABLET | Freq: Every day | ORAL | Status: DC
  Administered 2013-04-08 – 2013-04-12 (×5): 2 via ORAL
  Filled 2013-04-08 (×6): qty 2

## 2013-04-08 MED ORDER — PROMETHAZINE HCL 25 MG PO TABS: 25.0000 mg | ORAL_TABLET | Freq: Four times a day (QID) | ORAL | Status: DC | PRN

## 2013-04-08 MED ORDER — METOCLOPRAMIDE HCL 5 MG/ML IJ SOLN: 5.0000 mg | Freq: Three times a day (TID) | INTRAMUSCULAR | Status: DC | PRN

## 2013-04-08 MED ORDER — NEOSTIGMINE METHYLSULFATE 1 MG/ML IJ SOLN
INTRAMUSCULAR | Status: DC | PRN
  Administered 2013-04-08: 3 mg via INTRAVENOUS

## 2013-04-08 MED ORDER — ARTIFICIAL TEARS OP OINT
TOPICAL_OINTMENT | OPHTHALMIC | Status: DC | PRN
  Administered 2013-04-08: 1 via OPHTHALMIC

## 2013-04-08 MED ORDER — ALUM & MAG HYDROXIDE-SIMETH 200-200-20 MG/5ML PO SUSP: 30.0000 mL | ORAL | Status: DC | PRN

## 2013-04-08 MED ORDER — POTASSIUM CHLORIDE IN NACL 20-0.45 MEQ/L-% IV SOLN
INTRAVENOUS | Status: DC
  Administered 2013-04-08 – 2013-04-09 (×2): via INTRAVENOUS
  Filled 2013-04-08 (×9): qty 1000

## 2013-04-08 MED ORDER — OXYCODONE-ACETAMINOPHEN 10-325 MG PO TABS: 1.0000 | ORAL_TABLET | Freq: Four times a day (QID) | ORAL | Status: DC | PRN

## 2013-04-08 MED ORDER — INSULIN GLARGINE 100 UNIT/ML SOLOSTAR PEN: 24.0000 [IU] | PEN_INJECTOR | Freq: Every day | SUBCUTANEOUS | Status: DC

## 2013-04-08 MED ORDER — LIDOCAINE HCL (CARDIAC) 20 MG/ML IV SOLN
INTRAVENOUS | Status: DC | PRN
  Administered 2013-04-08: 100 mg via INTRAVENOUS

## 2013-04-08 MED ORDER — SUCCINYLCHOLINE CHLORIDE 20 MG/ML IJ SOLN
INTRAMUSCULAR | Status: AC
  Filled 2013-04-08: qty 1

## 2013-04-08 MED ORDER — METFORMIN HCL 500 MG PO TABS
1000.0000 mg | ORAL_TABLET | Freq: Two times a day (BID) | ORAL | Status: DC
  Administered 2013-04-08 – 2013-04-12 (×8): 1000 mg via ORAL
  Filled 2013-04-08 (×11): qty 2

## 2013-04-08 MED ORDER — LISINOPRIL 20 MG PO TABS
20.0000 mg | ORAL_TABLET | Freq: Every day | ORAL | Status: DC
  Administered 2013-04-08 – 2013-04-12 (×5): 20 mg via ORAL
  Filled 2013-04-08 (×5): qty 1

## 2013-04-08 MED ORDER — METOCLOPRAMIDE HCL 5 MG/ML IJ SOLN
INTRAMUSCULAR | Status: AC
  Filled 2013-04-08: qty 2

## 2013-04-08 MED ORDER — LEVOTHYROXINE SODIUM 100 MCG PO TABS
100.0000 ug | ORAL_TABLET | Freq: Every day | ORAL | Status: DC
  Administered 2013-04-09 – 2013-04-12 (×4): 100 ug via ORAL
  Filled 2013-04-08 (×5): qty 1

## 2013-04-08 MED ORDER — ENOXAPARIN SODIUM 30 MG/0.3ML ~~LOC~~ SOLN
30.0000 mg | Freq: Two times a day (BID) | SUBCUTANEOUS | Status: DC
  Administered 2013-04-09 – 2013-04-12 (×7): 30 mg via SUBCUTANEOUS
  Filled 2013-04-08 (×9): qty 0.3

## 2013-04-08 MED ORDER — ONDANSETRON HCL 4 MG/2ML IJ SOLN
4.0000 mg | Freq: Four times a day (QID) | INTRAMUSCULAR | Status: DC | PRN
  Administered 2013-04-08: 4 mg via INTRAVENOUS

## 2013-04-08 MED ORDER — SIMVASTATIN 40 MG PO TABS
40.0000 mg | ORAL_TABLET | Freq: Every day | ORAL | Status: DC
  Administered 2013-04-08 – 2013-04-11 (×4): 40 mg via ORAL
  Filled 2013-04-08 (×5): qty 1

## 2013-04-08 MED ORDER — ARTIFICIAL TEARS OP OINT
TOPICAL_OINTMENT | OPHTHALMIC | Status: AC
  Filled 2013-04-08: qty 3.5

## 2013-04-08 MED ORDER — DEXAMETHASONE SODIUM PHOSPHATE 10 MG/ML IJ SOLN
10.0000 mg | Freq: Three times a day (TID) | INTRAMUSCULAR | Status: AC
  Administered 2013-04-09: 10 mg via INTRAVENOUS
  Filled 2013-04-08 (×3): qty 1

## 2013-04-08 MED ORDER — PROPOFOL 10 MG/ML IV BOLUS
INTRAVENOUS | Status: AC
  Filled 2013-04-08: qty 20

## 2013-04-08 MED ORDER — HYDROMORPHONE HCL PF 1 MG/ML IJ SOLN
1.0000 mg | INTRAMUSCULAR | Status: DC | PRN
  Administered 2013-04-08: 1 mg via INTRAVENOUS
  Filled 2013-04-08: qty 1

## 2013-04-08 MED ORDER — KETOROLAC TROMETHAMINE 15 MG/ML IJ SOLN
7.5000 mg | Freq: Four times a day (QID) | INTRAMUSCULAR | Status: AC
  Administered 2013-04-09 (×3): 7.5 mg via INTRAVENOUS
  Filled 2013-04-08: qty 1

## 2013-04-08 MED ORDER — METOCLOPRAMIDE HCL 5 MG/ML IJ SOLN
INTRAMUSCULAR | Status: DC | PRN
  Administered 2013-04-08: 5 mg via INTRAVENOUS

## 2013-04-08 MED ORDER — ONDANSETRON HCL 4 MG/2ML IJ SOLN: 4.0000 mg | Freq: Once | INTRAMUSCULAR | Status: DC | PRN

## 2013-04-08 MED ORDER — MIDAZOLAM HCL 5 MG/5ML IJ SOLN
INTRAMUSCULAR | Status: DC | PRN
Start: 2013-04-08 — End: 2013-04-08
  Administered 2013-04-08: 2 mg via INTRAVENOUS

## 2013-04-08 MED ORDER — EPHEDRINE SULFATE 50 MG/ML IJ SOLN
INTRAMUSCULAR | Status: AC
  Filled 2013-04-08: qty 1

## 2013-04-08 MED ORDER — LACTATED RINGERS IV SOLN
INTRAVENOUS | Status: DC | PRN
  Administered 2013-04-08 (×2): via INTRAVENOUS

## 2013-04-08 MED ORDER — PHENOL 1.4 % MT LIQD
1.0000 | OROMUCOSAL | Status: DC | PRN
Start: 2013-04-08 — End: 2013-04-12
  Filled 2013-04-08: qty 177

## 2013-04-08 MED ORDER — FENTANYL CITRATE 0.05 MG/ML IJ SOLN
INTRAMUSCULAR | Status: DC | PRN
  Administered 2013-04-08: 50 ug via INTRAVENOUS
  Administered 2013-04-08: 25 ug via INTRAVENOUS
  Administered 2013-04-08: 150 ug via INTRAVENOUS
  Administered 2013-04-08 (×3): 50 ug via INTRAVENOUS
  Administered 2013-04-08: 25 ug via INTRAVENOUS

## 2013-04-08 MED ORDER — METHOCARBAMOL 500 MG PO TABS
500.0000 mg | ORAL_TABLET | Freq: Four times a day (QID) | ORAL | Status: DC | PRN
Start: 2013-04-08 — End: 2013-04-12
  Administered 2013-04-08 – 2013-04-11 (×7): 500 mg via ORAL
  Filled 2013-04-08 (×7): qty 1

## 2013-04-08 MED ORDER — DIPHENHYDRAMINE HCL 12.5 MG/5ML PO ELIX
12.5000 mg | ORAL_SOLUTION | ORAL | Status: DC | PRN
  Administered 2013-04-10: 25 mg via ORAL
  Administered 2013-04-10: 12.5 mg via ORAL
  Filled 2013-04-08 (×3): qty 10

## 2013-04-08 MED ORDER — POLYETHYLENE GLYCOL 3350 17 G PO PACK
17.0000 g | PACK | Freq: Every day | ORAL | Status: DC | PRN
  Administered 2013-04-11: 17 g via ORAL
  Filled 2013-04-08: qty 1

## 2013-04-08 MED ORDER — ONDANSETRON HCL 4 MG/2ML IJ SOLN
INTRAMUSCULAR | Status: AC
  Filled 2013-04-08: qty 2

## 2013-04-08 MED ORDER — INSULIN ASPART 100 UNIT/ML ~~LOC~~ SOLN
0.0000 [IU] | Freq: Three times a day (TID) | SUBCUTANEOUS | Status: DC
  Administered 2013-04-08: 11 [IU] via SUBCUTANEOUS
  Administered 2013-04-09: 7 [IU] via SUBCUTANEOUS
  Administered 2013-04-09: 20 [IU] via SUBCUTANEOUS
  Administered 2013-04-09: 15 [IU] via SUBCUTANEOUS
  Administered 2013-04-10 (×2): 4 [IU] via SUBCUTANEOUS
  Administered 2013-04-10: 3 [IU] via SUBCUTANEOUS
  Administered 2013-04-11: 4 [IU] via SUBCUTANEOUS
  Administered 2013-04-11: 3 [IU] via SUBCUTANEOUS
  Administered 2013-04-11 – 2013-04-12 (×2): 4 [IU] via SUBCUTANEOUS

## 2013-04-08 MED ORDER — GLYCOPYRROLATE 0.2 MG/ML IJ SOLN
INTRAMUSCULAR | Status: AC
  Filled 2013-04-08: qty 3

## 2013-04-08 MED ORDER — INSULIN GLARGINE 100 UNIT/ML ~~LOC~~ SOLN
24.0000 [IU] | Freq: Every day | SUBCUTANEOUS | Status: DC
  Administered 2013-04-08 – 2013-04-11 (×4): 24 [IU] via SUBCUTANEOUS
  Filled 2013-04-08 (×5): qty 0.24

## 2013-04-08 MED ORDER — ASPIRIN 81 MG PO TABS: 81.0000 mg | ORAL_TABLET | Freq: Every day | ORAL | Status: DC

## 2013-04-08 MED ORDER — KETOROLAC TROMETHAMINE 15 MG/ML IJ SOLN
INTRAMUSCULAR | Status: AC
  Administered 2013-04-08: 7.5 mg
  Filled 2013-04-08: qty 1

## 2013-04-08 MED ORDER — ROCURONIUM BROMIDE 100 MG/10ML IV SOLN
INTRAVENOUS | Status: DC | PRN
Start: 2013-04-08 — End: 2013-04-08
  Administered 2013-04-08: 50 mg via INTRAVENOUS

## 2013-04-08 MED ORDER — LISINOPRIL-HYDROCHLOROTHIAZIDE 20-12.5 MG PO TABS
1.0000 | ORAL_TABLET | Freq: Every day | ORAL | Status: DC
Start: 2013-04-08 — End: 2013-04-08

## 2013-04-08 MED ORDER — METOCLOPRAMIDE HCL 5 MG PO TABS
5.0000 mg | ORAL_TABLET | Freq: Three times a day (TID) | ORAL | Status: DC | PRN
  Filled 2013-04-08: qty 2

## 2013-04-08 MED ORDER — OXYCODONE HCL 5 MG PO TABS
5.0000 mg | ORAL_TABLET | ORAL | Status: DC | PRN
  Administered 2013-04-08 – 2013-04-12 (×13): 10 mg via ORAL
  Filled 2013-04-08 (×14): qty 2

## 2013-04-08 MED ORDER — MENTHOL 3 MG MT LOZG
1.0000 | LOZENGE | OROMUCOSAL | Status: DC | PRN
  Filled 2013-04-08: qty 9

## 2013-04-08 MED ORDER — GLIMEPIRIDE 2 MG PO TABS
2.0000 mg | ORAL_TABLET | Freq: Every day | ORAL | Status: DC
  Administered 2013-04-09 – 2013-04-12 (×4): 2 mg via ORAL
  Filled 2013-04-08 (×5): qty 1

## 2013-04-08 MED ORDER — ONDANSETRON HCL 4 MG PO TABS
4.0000 mg | ORAL_TABLET | Freq: Four times a day (QID) | ORAL | Status: DC | PRN
  Administered 2013-04-10 – 2013-04-11 (×2): 4 mg via ORAL
  Filled 2013-04-08 (×2): qty 1

## 2013-04-08 MED ORDER — ENOXAPARIN SODIUM 30 MG/0.3ML ~~LOC~~ SOLN: 30.0000 mg | Freq: Two times a day (BID) | SUBCUTANEOUS | Status: DC

## 2013-04-08 MED ORDER — LIDOCAINE HCL (CARDIAC) 20 MG/ML IV SOLN
INTRAVENOUS | Status: AC
Start: 2013-04-08 — End: 2013-04-08
  Filled 2013-04-08: qty 5

## 2013-04-08 MED ORDER — DOCUSATE SODIUM 100 MG PO CAPS
100.0000 mg | ORAL_CAPSULE | Freq: Two times a day (BID) | ORAL | Status: DC
  Administered 2013-04-08 – 2013-04-12 (×8): 100 mg via ORAL
  Filled 2013-04-08 (×9): qty 1

## 2013-04-08 MED ORDER — GLYCOPYRROLATE 0.2 MG/ML IJ SOLN
INTRAMUSCULAR | Status: DC | PRN
  Administered 2013-04-08: 0.6 mg via INTRAVENOUS

## 2013-04-08 MED ORDER — PHENYLEPHRINE 40 MCG/ML (10ML) SYRINGE FOR IV PUSH (FOR BLOOD PRESSURE SUPPORT)
PREFILLED_SYRINGE | INTRAVENOUS | Status: AC
  Filled 2013-04-08: qty 10

## 2013-04-08 MED ORDER — MIDAZOLAM HCL 2 MG/2ML IJ SOLN
INTRAMUSCULAR | Status: AC
  Filled 2013-04-08: qty 2

## 2013-04-08 SURGICAL SUPPLY — 60 items
BANDAGE ELASTIC 6 VELCRO ST LF (GAUZE/BANDAGES/DRESSINGS) ×6 IMPLANT
BANDAGE ESMARK 6X9 LF (GAUZE/BANDAGES/DRESSINGS) ×1 IMPLANT
BENZOIN TINCTURE PRP APPL 2/3 (GAUZE/BANDAGES/DRESSINGS) ×3 IMPLANT
BLADE SAG 18X100X1.27 (BLADE) ×3 IMPLANT
BLADE SAW RECIP 87.9 MT (BLADE) ×3 IMPLANT
BLADE SAW SGTL 13X75X1.27 (BLADE) ×3 IMPLANT
BNDG ESMARK 6X9 LF (GAUZE/BANDAGES/DRESSINGS) ×3
BOOTCOVER CLEANROOM LRG (PROTECTIVE WEAR) ×6 IMPLANT
BOWL SMART MIX CTS (DISPOSABLE) ×3 IMPLANT
CAPT RP KNEE ×3 IMPLANT
CEMENT HV SMART SET (Cement) ×6 IMPLANT
CLOSURE STERI-STRIP 1/2X4 (GAUZE/BANDAGES/DRESSINGS) ×1
CLSR STERI-STRIP ANTIMIC 1/2X4 (GAUZE/BANDAGES/DRESSINGS) ×2 IMPLANT
COVER SURGICAL LIGHT HANDLE (MISCELLANEOUS) ×3 IMPLANT
CUFF TOURNIQUET SINGLE 34IN LL (TOURNIQUET CUFF) IMPLANT
DRAPE EXTREMITY T 121X128X90 (DRAPE) ×3 IMPLANT
DRAPE PROXIMA HALF (DRAPES) ×3 IMPLANT
DRAPE U-SHAPE 47X51 STRL (DRAPES) ×3 IMPLANT
DRSG PAD ABDOMINAL 8X10 ST (GAUZE/BANDAGES/DRESSINGS) ×3 IMPLANT
DURAPREP 26ML APPLICATOR (WOUND CARE) ×3 IMPLANT
ELECT CAUTERY BLADE 6.4 (BLADE) ×3 IMPLANT
ELECT REM PT RETURN 9FT ADLT (ELECTROSURGICAL) ×3
ELECTRODE REM PT RTRN 9FT ADLT (ELECTROSURGICAL) ×1 IMPLANT
FACESHIELD LNG OPTICON STERILE (SAFETY) ×3 IMPLANT
GLOVE BIOGEL PI ORTHO PRO SZ8 (GLOVE) ×4
GLOVE ORTHO TXT STRL SZ7.5 (GLOVE) ×3 IMPLANT
GLOVE PI ORTHO PRO STRL SZ8 (GLOVE) ×2 IMPLANT
GLOVE SURG ORTHO 8.0 STRL STRW (GLOVE) ×3 IMPLANT
GOWN BRE IMP PREV XXLGXLNG (GOWN DISPOSABLE) ×3 IMPLANT
GOWN STRL REUS W/ TWL XL LVL3 (GOWN DISPOSABLE) ×1 IMPLANT
GOWN STRL REUS W/TWL XL LVL3 (GOWN DISPOSABLE) ×2
HANDPIECE INTERPULSE COAX TIP (DISPOSABLE) ×2
HOOD PEEL AWAY FACE SHEILD DIS (HOOD) ×6 IMPLANT
IMMOBILIZER KNEE 22 UNIV (SOFTGOODS) ×3 IMPLANT
KIT BASIN OR (CUSTOM PROCEDURE TRAY) ×3 IMPLANT
KIT ROOM TURNOVER OR (KITS) ×3 IMPLANT
MANIFOLD NEPTUNE II (INSTRUMENTS) ×3 IMPLANT
NS IRRIG 1000ML POUR BTL (IV SOLUTION) ×3 IMPLANT
PACK TOTAL JOINT (CUSTOM PROCEDURE TRAY) ×3 IMPLANT
PAD ABD 8X10 STRL (GAUZE/BANDAGES/DRESSINGS) ×3 IMPLANT
PAD ARMBOARD 7.5X6 YLW CONV (MISCELLANEOUS) ×6 IMPLANT
PAD CAST 4YDX4 CTTN HI CHSV (CAST SUPPLIES) ×1 IMPLANT
PADDING CAST COTTON 4X4 STRL (CAST SUPPLIES) ×2
PADDING CAST COTTON 6X4 STRL (CAST SUPPLIES) ×3 IMPLANT
SET HNDPC FAN SPRY TIP SCT (DISPOSABLE) ×1 IMPLANT
SPONGE GAUZE 4X4 12PLY (GAUZE/BANDAGES/DRESSINGS) ×3 IMPLANT
SPONGE GAUZE 4X4 12PLY STER LF (GAUZE/BANDAGES/DRESSINGS) ×3 IMPLANT
STAPLER VISISTAT 35W (STAPLE) ×3 IMPLANT
SUCTION FRAZIER TIP 10 FR DISP (SUCTIONS) ×3 IMPLANT
SUT MNCRL AB 4-0 PS2 18 (SUTURE) IMPLANT
SUT VIC AB 0 CT1 27 (SUTURE) ×2
SUT VIC AB 0 CT1 27XBRD ANBCTR (SUTURE) ×1 IMPLANT
SUT VIC AB 2-0 CT1 27 (SUTURE) ×2
SUT VIC AB 2-0 CT1 TAPERPNT 27 (SUTURE) ×1 IMPLANT
SUT VIC AB 3-0 SH 8-18 (SUTURE) ×6 IMPLANT
SYR 30ML LL (SYRINGE) ×3 IMPLANT
TOWEL OR 17X24 6PK STRL BLUE (TOWEL DISPOSABLE) ×3 IMPLANT
TOWEL OR 17X26 10 PK STRL BLUE (TOWEL DISPOSABLE) ×3 IMPLANT
TRAY FOLEY CATH 16FRSI W/METER (SET/KITS/TRAYS/PACK) IMPLANT
WATER STERILE IRR 1000ML POUR (IV SOLUTION) ×6 IMPLANT

## 2013-04-08 NOTE — Op Note (Signed)
DATE OF SURGERY:  04/08/2013 TIME: 9:25 AM  PATIENT NAME:  Danielle Ferguson   AGE: 63 y.o.    PRE-OPERATIVE DIAGNOSIS:  Right knee DJD  POST-OPERATIVE DIAGNOSIS:  Same  PROCEDURE:  Procedure(s): TOTAL KNEE ARTHROPLASTY   SURGEON:  Eulas PostLANDAU,Corydon Schweiss P, MD   ASSISTANT:  Janace LittenBrandon Parry, OPA-C, present and scrubbed throughout the case, critical for assistance with exposure, retraction, instrumentation, and closure.   OPERATIVE IMPLANTS: Depuy PFC Sigma, Posterior Stabilized.  Femur size 2.5, Tibia size 3, Patella size 32 3-peg oval button, with a 10 mm polyethylene insert.   PREOPERATIVE INDICATIONS:  Danielle Ferguson is a 63 y.o. year old female with end stage bone on bone degenerative arthritis of the knee who failed conservative treatment, including injections, antiinflammatories, activity modification, and assistive devices, and had significant impairment of their activities of daily living, and elected for Total Knee Arthroplasty.   The risks, benefits, and alternatives were discussed at length including but not limited to the risks of infection, bleeding, nerve injury, stiffness, blood clots, the need for revision surgery, cardiopulmonary complications, among others, and they were willing to proceed.   OPERATIVE DESCRIPTION:  The patient was brought to the operative room and placed in a supine position.  General anesthesia was administered.  IV antibiotics were given.  The lower extremity was prepped and draped in the usual sterile fashion.  Time out was performed.  The leg was elevated and exsanguinated and the tourniquet was inflated.  Anterior quadriceps tendon splitting approach was performed.  The patella was everted and osteophytes were removed.  The anterior horn of the medial and lateral meniscus was removed.   The distal femur was opened with the drill and the intramedullary distal femoral cutting jig was utilized, set at 5 degrees resecting 10 mm off the distal femur.   Care was taken to protect the collateral ligaments.  Then the extramedullary tibial cutting jig was utilized making the appropriate cut using the anterior tibial crest as a reference building in appropriate posterior slope.  Care was taken during the cut to protect the medial and collateral ligaments.  The proximal tibia was removed along with the posterior horns of the menisci.  The PCL was sacrificed.    The extensor gap was measured and was approximately 10mm.    The distal femoral sizing jig was applied, taking care to avoid notching.  Then the 4-in-1 cutting jig was applied and the anterior and posterior femur was cut, along with the chamfer cuts.  All posterior osteophytes were removed.  The flexion gap was then measured and was symmetric with the extension gap.  I completed the distal femoral preparation using the appropriate jig to prepare the box.  The patella was then measured, and cut with the saw.  The patella was very small, similar to the contralateral side, and measured 23 mm in thickness at its thickest point (medially), down to almost 15 mm at its thinnest point. After the resection, I had removed more of the medial bone than lateral bone, and the lateral thickness was 15 mm, while the medial thickness was about 13 mm.  The proximal tibia sized and prepared accordingly with the reamer and the punch, and then all components were trialed with the 10mm poly insert.  The knee was found to have excellent balance and full motion.    The above named components were then cemented into place and all excess cement was removed.  The real polyethylene implant was placed.  The knee was easily taken  through a range of motion and the patella tracked well and the knee irrigated copiously and the parapatellar and subcutaneous tissue closed with vicryl, and monocryl with steri strips for the skin.  The wounds were injected with marcaine, and dressed with sterile gauze and the tourniquet released and  the patient was awakened and returned to the PACU in stable and satisfactory condition.  There were no complications.  Total tourniquet time was ~90 minutes.

## 2013-04-08 NOTE — Anesthesia Postprocedure Evaluation (Signed)
Anesthesia Post Note  Patient: Danielle Ferguson  Procedure(s) Performed: Procedure(s) (LRB): TOTAL KNEE ARTHROPLASTY (Right)  Anesthesia type: general  Patient location: PACU  Post pain: Pain level controlled  Post assessment: Patient's Cardiovascular Status Stable  Last Vitals:  Filed Vitals:   04/08/13 1416  BP: 148/65  Pulse: 104  Temp:   Resp: 25    Post vital signs: Reviewed and stable  Level of consciousness: sedated  Complications: No apparent anesthesia complications

## 2013-04-08 NOTE — Anesthesia Procedure Notes (Addendum)
Procedure Name: Intubation Date/Time: 04/08/2013 7:35 AM Performed by: Carmela RimaMARTINELLI, JOHN F Pre-anesthesia Checklist: Patient identified, Timeout performed, Emergency Drugs available, Suction available and Patient being monitored Patient Re-evaluated:Patient Re-evaluated prior to inductionOxygen Delivery Method: Circle system utilized Preoxygenation: Pre-oxygenation with 100% oxygen Intubation Type: IV induction Ventilation: Mask ventilation without difficulty Laryngoscope Size: Mac and 3 Grade View: Grade I Tube type: Oral Tube size: 7.5 mm Number of attempts: 1 Placement Confirmation: positive ETCO2,  ETT inserted through vocal cords under direct vision and breath sounds checked- equal and bilateral Secured at: 21 cm Tube secured with: Tape Dental Injury: Teeth and Oropharynx as per pre-operative assessment    Anesthesia Regional Block:  Femoral nerve block  Pre-Anesthetic Checklist: ,, timeout performed, Correct Patient, Correct Site, Correct Laterality, Correct Procedure, Correct Position, site marked, Risks and benefits discussed,  Surgical consent,  Pre-op evaluation,  At surgeon's request and post-op pain management  Laterality: Right  Prep: chloraprep       Needles:  Injection technique: Single-shot  Needle Type: Echogenic Stimulator Needle     Needle Length: 9cm 9 cm Needle Gauge: 21 and 21 G    Additional Needles:  Procedures: ultrasound guided (picture in chart) and nerve stimulator Femoral nerve block  Nerve Stimulator or Paresthesia:  Response: 0.4 mA,   Additional Responses:   Narrative:  Start time: 04/08/2013 7:05 AM End time: 04/08/2013 7:20 AM Injection made incrementally with aspirations every 5 mL.  Performed by: Personally  Anesthesiologist: Arta BruceKevin Alexander Mcauley MD  Additional Notes: Monitors applied. Patient sedated. Sterile prep and drape,hand hygiene and sterile gloves were used. Relevant anatomy identified.Needle position confirmed.Local anesthetic  injected incrementally after negative aspiration. Local anesthetic spread visualized around nerve(s). Vascular puncture avoided. No complications. Image printed for medical record.The patient tolerated the procedure well.

## 2013-04-08 NOTE — Preoperative (Signed)
Beta Blockers   Reason not to administer Beta Blockers:Not Applicable 

## 2013-04-08 NOTE — Plan of Care (Signed)
Problem: Consults Goal: Diagnosis- Total Joint Replacement Primary Total Knee Right     

## 2013-04-08 NOTE — Transfer of Care (Signed)
Immediate Anesthesia Transfer of Care Note  Patient: Danielle Ferguson  Procedure(s) Performed: Procedure(s): TOTAL KNEE ARTHROPLASTY (Right)  Patient Location: PACU  Anesthesia Type:General  Level of Consciousness: awake, alert  and oriented  Airway & Oxygen Therapy: Patient Spontanous Breathing and Patient connected to nasal cannula oxygen  Post-op Assessment: Report given to PACU RN, Post -op Vital signs reviewed and stable and Patient moving all extremities  Post vital signs: Reviewed and stable  Complications: No apparent anesthesia complications

## 2013-04-08 NOTE — Discharge Instructions (Signed)
Diet: As you were doing prior to hospitalization  ° °Shower:  May shower but keep the wounds dry, use an occlusive plastic wrap, NO SOAKING IN TUB.  If the bandage gets wet, change with a clean dry gauze. ° °Dressing:  You may change your dressing 3-5 days after surgery.  Then change the dressing daily with sterile gauze dressing.   ° °There are sticky tapes (steri-strips) on your wounds and all the stitches are absorbable.  Leave the steri-strips in place when changing your dressings, they will peel off with time, usually 2-3 weeks. ° °Activity:  Increase activity slowly as tolerated, but follow the weight bearing instructions below.  No lifting or driving for 6 weeks. ° °Weight Bearing:   As tolerated.   ° °To prevent constipation: you may use a stool softener such as - ° °Colace (over the counter) 100 mg by mouth twice a day  °Drink plenty of fluids (prune juice may be helpful) and high fiber foods °Miralax (over the counter) for constipation as needed.   ° °Itching:  If you experience itching with your medications, try taking only a single pain pill, or even half a pain pill at a time.  You may take up to 10 pain pills per day, and you can also use benadryl over the counter for itching or also to help with sleep.  ° °Precautions:  If you experience chest pain or shortness of breath - call 911 immediately for transfer to the hospital emergency department!! ° °If you develop a fever greater that 101 F, purulent drainage from wound, increased redness or drainage from wound, or calf pain -- Call the office at [phone]                                                °Follow- Up Appointment:  Please call for an appointment to be seen in 2 weeks [place] - [phone] ° ° ° ° ° °

## 2013-04-08 NOTE — H&P (Signed)
PREOPERATIVE H&P  Chief Complaint: Right knee DJD  HPI: Danielle Ferguson is a 63 y.o. female who presents for preoperative history and physical with a diagnosis of Right knee DJD. Symptoms are rated as moderate to severe, and have been worsening.  This is significantly impairing activities of daily living.  She has elected for surgical management. She has failed conservative measures including injections, activity modification, attempted weight loss, and has had a total knee replacement on the other side with excellent results and wishes to have the same procedure done.  Past Medical History  Diagnosis Date  . Hypertension   . Diabetes mellitus without complication   . High cholesterol   . Thyroid disease   . Chronic knee pain   . Shortness of breath     with walking  . Headache(784.0)   . Constipation   . Hypothyroidism   . HTN (hypertension) 01/10/2013  . PONV (postoperative nausea and vomiting)    Past Surgical History  Procedure Laterality Date  . Abdominal surgery    . Total knee arthroplasty Left 01/07/2013    Procedure: TOTAL KNEE ARTHROPLASTY;  Surgeon: Eulas Post, MD;  Location: [facility] OR;  Service: Orthopedics;  Laterality: Left;  . Appendectomy     History   Social History  . Marital Status: Widowed    Spouse Name: N/A    Number of Children: N/A  . Years of Education: N/A   Social History Main Topics  . Smoking status: Never Smoker   . Smokeless tobacco: Never Used  . Alcohol Use: No  . Drug Use: No  . Sexual Activity: None   Other Topics Concern  . None   Social History Narrative  . None   History reviewed. No pertinent family history. No Known Allergies Prior to Admission medications   Medication Sig Start Date End Date Taking? Authorizing Provider  Acetaminophen (TYLENOL PO) Take 1 tablet by mouth every 4 (four) hours as needed.   Yes Historical Provider, MD  aspirin 81 MG tablet Take 81 mg by mouth daily.   Yes Historical Provider, MD   docusate sodium (COLACE) 100 MG capsule Take 100 mg by mouth 2 (two) times daily.   Yes Historical Provider, MD  glimepiride (AMARYL) 2 MG tablet Take 1 tablet (2 mg total) by mouth daily before breakfast. 02/06/13  Yes Sherren Mocha, MD  Insulin Glargine (LANTUS SOLOSTAR) 100 UNIT/ML Solostar Pen Take 34 units in the morning and 24 units at bedtime. 02/06/13  Yes Sherren Mocha, MD  levothyroxine (SYNTHROID, LEVOTHROID) 100 MCG tablet Take 1 tab by mouth daily on Mon, Wed, Fri, and 1/2 tab other days. 02/09/13  Yes Sherren Mocha, MD  lisinopril-hydrochlorothiazide (PRINZIDE,ZESTORETIC) 20-12.5 MG per tablet Take 1 tablet by mouth daily. PATIENT NEEDS OFFICE VISIT FOR ADDITIONAL REFILLS 02/06/13  Yes Sherren Mocha, MD  metFORMIN (GLUCOPHAGE) 1000 MG tablet Take 1 tablet (1,000 mg total) by mouth 2 (two) times daily with a meal. 02/06/13  Yes Sherren Mocha, MD  sennosides-docusate sodium (SENOKOT-S) 8.6-50 MG tablet Take 2 tablets by mouth daily. 01/07/13  Yes Eulas Post, MD  simvastatin (ZOCOR) 40 MG tablet TAKE 1 TABLET (40 MG TOTAL) BY MOUTH DAILY.   Yes Godfrey Pick, PA-C  enoxaparin (LOVENOX) 30 MG/0.3ML injection Inject 0.3 mLs (30 mg total) into the skin every 12 (twelve) hours. 01/07/13   Eulas Post, MD     Positive ROS: All other systems have been reviewed and were otherwise negative with  the exception of those mentioned in the HPI and as above.  Physical Exam: General: Alert, no acute distress Cardiovascular: No pedal edema Respiratory: No cyanosis, no use of accessory musculature GI: No organomegaly, abdomen is soft and non-tender Skin: No lesions in the area of chief complaint Neurologic: Sensation intact distally Psychiatric: Patient is competent for consent with normal mood and affect Lymphatic: No axillary or cervical lymphadenopathy  MUSCULOSKELETAL: Right knee has varus alignment with positive crepitance, range of motion from 10 to 90,  Radiographs demonstrate end-stage  degenerative changes in the right knee.  Assessment: Right knee DJD  Plan: Plan for Procedure(s): TOTAL KNEE ARTHROPLASTY  The risks benefits and alternatives were discussed with the patient including but not limited to the risks of nonoperative treatment, versus surgical intervention including infection, bleeding, nerve injury,  blood clots, cardiopulmonary complications, morbidity, mortality, among others, and they were willing to proceed.   Eulas PostLANDAU,Salim Forero P, MD Cell [phone]   04/08/2013 7:14 AM

## 2013-04-08 NOTE — Anesthesia Preprocedure Evaluation (Addendum)
Anesthesia Evaluation  Patient identified by MRN, date of birth, ID band Patient awake    Reviewed: Allergy & Precautions, H&P , NPO status , Patient's Chart, lab work & pertinent test results  History of Anesthesia Complications (+) PONV and history of anesthetic complications  Airway Mallampati: I TM Distance: >3 FB Neck ROM: Full    Dental  (+) Dental Advidsory Given, Teeth Intact   Pulmonary shortness of breath and with exertion,          Cardiovascular hypertension, Pt. on medications     Neuro/Psych  Headaches,    GI/Hepatic   Endo/Other  diabetes, Type 2, Oral Hypoglycemic AgentsHypothyroidism   Renal/GU      Musculoskeletal   Abdominal   Peds  Hematology   Anesthesia Other Findings   Reproductive/Obstetrics                          Anesthesia Physical Anesthesia Plan  ASA: II  Anesthesia Plan: General   Post-op Pain Management:    Induction: Intravenous  Airway Management Planned: Oral ETT  Additional Equipment:   Intra-op Plan:   Post-operative Plan: Extubation in OR  Informed Consent: I have reviewed the patients History and Physical, chart, labs and discussed the procedure including the risks, benefits and alternatives for the proposed anesthesia with the patient or authorized representative who has indicated his/her understanding and acceptance.   Dental Advisory Given  Plan Discussed with: CRNA and Surgeon  Anesthesia Plan Comments:        Anesthesia Quick Evaluation

## 2013-04-09 LAB — CBC
HEMATOCRIT: 33.3 % — AB (ref 36.0–46.0)
Hemoglobin: 11.1 g/dL — ABNORMAL LOW (ref 12.0–15.0)
MCH: 27.4 pg (ref 26.0–34.0)
MCHC: 33.3 g/dL (ref 30.0–36.0)
MCV: 82.2 fL (ref 78.0–100.0)
Platelets: 182 10*3/uL (ref 150–400)
RBC: 4.05 MIL/uL (ref 3.87–5.11)
RDW: 13.9 % (ref 11.5–15.5)
WBC: 11.2 10*3/uL — AB (ref 4.0–10.5)

## 2013-04-09 LAB — GLUCOSE, CAPILLARY
GLUCOSE-CAPILLARY: 322 mg/dL — AB (ref 70–99)
Glucose-Capillary: 231 mg/dL — ABNORMAL HIGH (ref 70–99)
Glucose-Capillary: 385 mg/dL — ABNORMAL HIGH (ref 70–99)
Glucose-Capillary: 106 mg/dL — ABNORMAL HIGH (ref 70–99)

## 2013-04-09 LAB — BASIC METABOLIC PANEL
BUN: 25 mg/dL — ABNORMAL HIGH (ref 6–23)
CHLORIDE: 96 meq/L (ref 96–112)
CO2: 24 mEq/L (ref 19–32)
Calcium: 8.4 mg/dL (ref 8.4–10.5)
Creatinine, Ser: 0.74 mg/dL (ref 0.50–1.10)
GFR calc non Af Amer: 89 mL/min — ABNORMAL LOW (ref >90)
Glucose, Bld: 307 mg/dL — ABNORMAL HIGH (ref 70–99)
POTASSIUM: 4.9 meq/L (ref 3.7–5.3)
SODIUM: 133 meq/L — AB (ref 137–147)

## 2013-04-09 MED ORDER — KETOROLAC TROMETHAMINE 15 MG/ML IJ SOLN
INTRAMUSCULAR | Status: AC
Start: 2013-04-09 — End: 2013-04-09
  Filled 2013-04-09: qty 1

## 2013-04-09 NOTE — Progress Notes (Signed)
Physical Therapy Treatment Patient Details Name: Danielle FiguresMonifah Ferguson MRN: [id number] DOB: 10/12/1950 Today's Date: 04/09/2013    History of Present Illness R TKA    PT Comments    Continuing progress with functional mobility ; Improving AROM R knee into flexion; Will need to continue work on R stance stability  Follow Up Recommendations  Home health PT;Supervision/Assistance - 24 hour     Equipment Recommendations  None recommended by PT    Recommendations for Other Services OT consult     Precautions / Restrictions Precautions Precautions: Knee;Fall Restrictions RLE Weight Bearing: Weight bearing as tolerated    Mobility  Bed Mobility Overal bed mobility: Needs Assistance Bed Mobility: Sit to Supine       Sit to supine: Min assist   General bed mobility comments: Cues for technique;min assist for RLE  Transfers Overall transfer level: Needs assistance Equipment used: Rolling walker (2 wheeled) Transfers: Sit to/from Stand Sit to Stand: Mod assist         General transfer comment: Cues for safety and hand placement;  R knee tended to buckle in stance  Ambulation/Gait Ambulation/Gait assistance: Min assist;Mod assist Ambulation Distance (Feet): 5 Feet (including backwards steps chair to bed) Assistive device: Rolling walker (2 wheeled) Gait Pattern/deviations: Step-to pattern     General Gait Details: Pivot steps recliner to bed; R knee tending to buckle, requiring phsyical block/support for stability in stance   Stairs            Wheelchair Mobility    Modified Rankin (Stroke Patients Only)       Balance                                    Cognition Arousal/Alertness: Awake/alert Behavior During Therapy: WFL for tasks assessed/performed Overall Cognitive Status: Within Functional Limits for tasks assessed                      Exercises Total Joint Exercises Quad Sets: AROM;Right;10 reps Heel Slides:  AAROM;Right;10 reps Straight Leg Raises: AAROM;Right;10 reps Goniometric ROM: 0-72    General Comments        Pertinent Vitals/Pain 3/10 in bed  patient repositioned for comfort and Optimal knee extension     Home Living                      Prior Function            PT Goals (current goals can now be found in the care plan section) Acute Rehab PT Goals Patient Stated Goal: Be able t go upstairs in her home to shower PT Goal Formulation: With patient Time For Goal Achievement: 04/16/13 Potential to Achieve Goals: Good Progress towards PT goals: Progressing toward goals    Frequency  7X/week    PT Plan Current plan remains appropriate    End of Session Equipment Utilized During Treatment: Gait belt Activity Tolerance: Patient tolerated treatment well Patient left: with call bell/phone within reach;with family/visitor present;in bed     Time: [id number] PT Time Calculation (min): 37 min  Charges:  $Therapeutic Exercise: 8-22 mins $Therapeutic Activity: 8-22 mins                    G Codes:      Olen PelGarrigan, Imre Vecchione Hamff 04/09/2013, 4:57 PM Van ClinesHolly Dekisha Mesmer, PT  Acute Rehabilitation Services Pager [phone] Office [phone]

## 2013-04-09 NOTE — Progress Notes (Signed)
Patient ID: Danielle Ferguson, female   DOB: 10/16/1950, 63 y.o.   MRN: [id number]     Subjective:  Patient reports pain as mild to moderate.  Patient asleep in bed and in no acute distress and when awake follows commands and is aware of time and place  Objective:   VITALS:   Filed Vitals:   04/09/13 0000 04/09/13 0031 04/09/13 0400 04/09/13 0505  BP:  148/120  129/64  Pulse:  113  93  Temp:  100.4 F (38 C)  98.4 F (36.9 C)  TempSrc:  Oral  Oral  Resp: 20 22 20 20   Weight:      SpO2: 93% 93% 93% 95%    ABD soft Sensation intact distally Dorsiflexion/Plantar flexion intact Incision: dressing C/D/I and no drainage Stable to verus valgus stress  Lab Results  Component Value Date   WBC 11.2* 04/09/2013   HGB 11.1* 04/09/2013   HCT 33.3* 04/09/2013   MCV 82.2 04/09/2013   PLT 182 04/09/2013     Assessment/Plan: 1 Day Post-Op   Principal Problem:   Osteoarthritis of right knee Active Problems:   Knee osteoarthritis   Advance diet Up with therapy WBAT Dry dressing PRN  Plan for DC tomorrow or Friday  Haskel KhanDOUGLAS PARRY, BRANDON 04/09/2013, 8:59 AM   Discussed and agree with above.  Teryl LucyJoshua Gawain Crombie, MD Cell [phone]

## 2013-04-09 NOTE — Progress Notes (Signed)
Utilization review completed.  

## 2013-04-09 NOTE — Evaluation (Signed)
Physical Therapy Evaluation Patient Details Name: Danielle Ferguson MRN: [id number] DOB: April 03, 1950 Today's Date: 04/09/2013   History of Present Illness  R TKA  Clinical Impression  Pt is s/p TKA resulting in the deficits listed below (see PT Problem List).  Pt will benefit from skilled PT to increase their independence and safety with mobility to allow discharge to the venue listed below.      Follow Up Recommendations Home health PT;Supervision/Assistance - 24 hour    Equipment Recommendations  None recommended by PT    Recommendations for Other Services OT consult     Precautions / Restrictions Precautions Precautions: Knee;Fall Restrictions Weight Bearing Restrictions: Yes RLE Weight Bearing: Weight bearing as tolerated      Mobility  Bed Mobility Overal bed mobility: Needs Assistance Bed Mobility: Supine to Sit     Supine to sit: Min assist     General bed mobility comments: Cues for technique; Able to pull self to long sit in bed without assist; min assist for RLE  Transfers Overall transfer level: Needs assistance Equipment used: Rolling walker (2 wheeled) Transfers: Sit to/from Stand Sit to Stand: +2 safety/equipment;Mod assist         General transfer comment: Cues for safety and hand placement; +2 assist for steadiniess and safety as R knee tended to buckle in stance  Ambulation/Gait Ambulation/Gait assistance: +2 physical assistance;Mod assist Ambulation Distance (Feet): 4 Feet Assistive device: Rolling walker (2 wheeled) Gait Pattern/deviations: Step-to pattern     General Gait Details: Pivot steps bed to recliner; R knee tending to buckle, requiring phsyical block/support for stability in stance  Stairs            Wheelchair Mobility    Modified Rankin (Stroke Patients Only)       Balance                                     Pertinent Vitals/Pain 8/10 R knee pain patient repositioned for comfort and Optimal  knee extension RN notified     Home Living Family/patient expects to be discharged to:: Private residence Living Arrangements: Children Available Help at Discharge: Family;Available 24 hours/day Type of Home: House Home Access: Stairs to enter Entrance Stairs-Rails: None Entrance Stairs-Number of Steps: 3-5 Home Layout: Two level;1/2 bath on main level;Able to live on main level with bedroom/bathroom Home Equipment: Dan Humphreys - 2 wheels;Bedside commode      Prior Function Level of Independence: Independent               Hand Dominance   Dominant Hand: Right    Extremity/Trunk Assessment   Upper Extremity Assessment: Defer to OT evaluation           Lower Extremity Assessment: RLE deficits/detail RLE Deficits / Details: Grossly decr AROM and strength, limited by pain postop       Communication   Communication: No difficulties;Prefers language other than English (Arabic -- caregiver, son, Ahmed, translated)  Cognition Arousal/Alertness: Awake/alert Behavior During Therapy: WFL for tasks assessed/performed Overall Cognitive Status: Within Functional Limits for tasks assessed                      General Comments      Exercises Total Joint Exercises Ankle Circles/Pumps: AROM;Both;5 reps Quad Sets: AROM;Right;10 reps Heel Slides: AAROM;Right;5 reps Goniometric ROM: 0-44      Assessment/Plan    PT Assessment Patient needs continued PT services  PT Diagnosis Difficulty walking;Acute pain   PT Problem List Decreased strength;Decreased range of motion;Decreased activity tolerance;Decreased balance;Decreased mobility;Decreased coordination;Decreased knowledge of use of DME;Decreased safety awareness;Decreased knowledge of precautions;Pain  PT Treatment Interventions DME instruction;Gait training;Stair training;Functional mobility training;Therapeutic activities;Therapeutic exercise;Patient/family education   PT Goals (Current goals can be found in the  Care Plan section) Acute Rehab PT Goals Patient Stated Goal: Be able t go upstairs in her home to shower PT Goal Formulation: With patient Time For Goal Achievement: 04/16/13 Potential to Achieve Goals: Good    Frequency 7X/week   Barriers to discharge        End of Session Equipment Utilized During Treatment: Gait belt Activity Tolerance: Patient tolerated treatment well Patient left: in chair;with call bell/phone within reach;with family/visitor present         Time: 0932-0956 PT Time Calculation (min): 24 min   Charges:   PT Evaluation $Initial PT Evaluation Tier I: 1 Procedure PT Treatments $Gait Training: 8-22 mins   PT G Codes:          Olen PelGarrigan, Kongmeng Santoro Hamff 04/09/2013, 12:47 PM Van ClinesHolly Illana Nolting, PT  Acute Rehabilitation Services Pager [phone] Office [phone]

## 2013-04-09 NOTE — Progress Notes (Signed)
Inpatient Diabetes Program Recommendations  AACE/ADA: New Consensus Statement on Inpatient Glycemic Control (2013)  Target Ranges:  Prepandial:   less than 140 mg/dL      Peak postprandial:   less than 180 mg/dL (1-2 hours)      Critically ill patients:  140 - 180 mg/dL   Reason for Visit: Hyperglycemia  Diabetes history: DM2 Outpatient Diabetes medications: 70/30 34 units in am, 24 units QHS, Amaryl 2 mg QD, metformin 1000 mg bid Current orders for Inpatient glycemic control: 70/30 24 units QHS, AMaryl 2 mg QD, metformin 1000 mg bid and Novolog resistant tid  Inpatient Diabetes Program Recommendations Insulin - Basal: Add Lantus 20 units QAM (Pt on 34 units in am and 24 units QHS at home) Correction (SSI): Add HS correction HgbA1C: HgbA1C - 7.5% Fair control at home Diet: Advance to CHO mod medium  Note: Will continue to follow. Thank you. Ailene Ardshonda Carey Johndrow, RD, LDN, CDE Inpatient Diabetes Coordinator [phone]

## 2013-04-10 ENCOUNTER — Encounter (HOSPITAL_COMMUNITY): Payer: Self-pay | Admitting: General Practice

## 2013-04-10 LAB — CBC
HCT: 29.7 % — ABNORMAL LOW (ref 36.0–46.0)
Hemoglobin: 10 g/dL — ABNORMAL LOW (ref 12.0–15.0)
MCH: 27.7 pg (ref 26.0–34.0)
MCHC: 33.7 g/dL (ref 30.0–36.0)
MCV: 82.3 fL (ref 78.0–100.0)
PLATELETS: 197 10*3/uL (ref 150–400)
RBC: 3.61 MIL/uL — ABNORMAL LOW (ref 3.87–5.11)
RDW: 14.1 % (ref 11.5–15.5)
WBC: 11.5 10*3/uL — AB (ref 4.0–10.5)

## 2013-04-10 LAB — GLUCOSE, CAPILLARY
GLUCOSE-CAPILLARY: 103 mg/dL — AB (ref 70–99)
GLUCOSE-CAPILLARY: 131 mg/dL — AB (ref 70–99)
Glucose-Capillary: 189 mg/dL — ABNORMAL HIGH (ref 70–99)
Glucose-Capillary: 208 mg/dL — ABNORMAL HIGH (ref 70–99)

## 2013-04-10 MED ORDER — PNEUMOCOCCAL VAC POLYVALENT 25 MCG/0.5ML IJ INJ
0.5000 mL | INJECTION | INTRAMUSCULAR | Status: DC
  Filled 2013-04-10: qty 0.5

## 2013-04-10 NOTE — Progress Notes (Addendum)
PT Cancellation Note  Patient Details Name: Danielle Ferguson MRN: [id number] DOB: 01-Jun-1950   Cancelled Treatment:     Pt. Sleeping this am, son did not want me to awaken hie mom.  Will f/u in PM   Ferman HammingBlankenship, Danielle Gasner B 04/10/2013, 1:23 PM Weldon PickingSusan Shelsea Hangartner PT Acute Rehab Services [phone] Beeper [phone]

## 2013-04-10 NOTE — Care Management Note (Signed)
CARE MANAGEMENT NOTE 04/10/2013  Patient:  Danielle Ferguson,Danielle Ferguson   Account Number:  [id number]  Date Initiated:  04/08/2013  Documentation initiated by:  Vance PeperBRADY,Crystal Scarberry  Subjective/Objective Assessment:   [age]yr old female s/p right total knee arthroplasty.     Action/Plan:   PT/OT eval pending  PT reccommends SNF, patient and son state she will be going home. Preoperatively setup with [facility], no changes.   Anticipated DC Date:  04/12/2013   Anticipated DC Plan:  HOME W HOME HEALTH SERVICES      DC Planning Services  CM consult      [facility]  DURABLE MEDICAL EQUIPMENT   Choice offered to / List presented to:  C-1 Patient   DME arranged  3-N-1  WALKER - ROLLING  CPM      DME agency  [facility]     HH arranged  HH[facility]      [facility]   Status of service:  Completed, signed off Medicare Important Message given?   (If response is "NO", the following Medicare IM given date fields will be blank) Date Medicare IM given:   Date Additional Medicare IM given:    Discharge Disposition:  HOME W HOME HEALTH SERVICES

## 2013-04-10 NOTE — Progress Notes (Signed)
Physical Therapy Treatment Patient Details Name: Danielle FiguresMonifah Proud MRN: [id number] DOB: 30-Jun-1950 Today's Date: 04/10/2013    History of Present Illness R TKA    PT Comments    Pt. With poor effort today, asking to be left alone so she can sleep.  Finally she was agreeable to use 3n1 at bedside in sit up in recliner afterward.  Not willing to complete TKA exercises except APs ans QS.  Informed pt's son f the importance of her participating in PT to maximize her functional recovery.  He says she is doing much like she did with her prior knee surgery.    Follow Up Recommendations  Home health PT;Supervision/Assistance - 24 hour     Equipment Recommendations  None recommended by PT    Recommendations for Other Services       Precautions / Restrictions Precautions Precautions: Knee;Fall Restrictions Weight Bearing Restrictions: Yes RLE Weight Bearing: Weight bearing as tolerated    Mobility  Bed Mobility Overal bed mobility: Needs Assistance Bed Mobility: Supine to Sit     Supine to sit: +2 for physical assistance;Min assist     General bed mobility comments: Cues for technique;min assist for RLE  Transfers Overall transfer level: Needs assistance Equipment used: Rolling walker (2 wheeled) Transfers: Sit to/from Stand Sit to Stand: +2 physical assistance;Min assist         General transfer comment: cues for safety, technique and hand placement via son.  Physical assist to rise to stand and to control descent  Ambulation/Gait Ambulation/Gait assistance: +2 physical assistance;Min assist Ambulation Distance (Feet): 3 Feet Assistive device: Rolling walker (2 wheeled) Gait Pattern/deviations: Step-to pattern;Antalgic     General Gait Details: Pt. took several pivoting steps to 3n1 with 2 assist. She was asking to use the bed pan, tried to encourage her that she needed to practice mobility in preparation for Dcing home.  She reluctantly agreed to 3n1.  Antalgic with  several steps to 3n1.  Pt. then assisted to recliner chair after urinating in 3n1   Stairs            Wheelchair Mobility    Modified Rankin (Stroke Patients Only)       Balance                                    Cognition Arousal/Alertness: Lethargic Behavior During Therapy: Flat affect Overall Cognitive Status: Within Functional Limits for tasks assessed                      Exercises Total Joint Exercises Ankle Circles/Pumps: AROM;Both;5 reps Quad Sets: AROM;Right;10 reps (pt. very limited in willingness to complete exercises) Goniometric ROM: grossly observed, -5 to 75 degrees    General Comments        Pertinent Vitals/Pain See vitals tab     Home Living Family/patient expects to be discharged to:: Private residence Living Arrangements: Children;Other relatives Available Help at Discharge: Family;Available 24 hours/day Type of Home: House Home Access: Stairs to enter Entrance Stairs-Rails: None Home Layout: Two level;1/2 bath on main level;Able to live on main level with bedroom/bathroom Home Equipment: Dan HumphreysWalker - 2 wheels;Bedside commode      Prior Function Level of Independence: Independent          PT Goals (current goals can now be found in the care plan section) Acute Rehab PT Goals Patient Stated Goal: None stated Progress towards PT goals:  Not progressing toward goals - comment (pt.  not fully participating witjh PT sessions)    Frequency  7X/week    PT Plan Current plan remains appropriate    End of Session Equipment Utilized During Treatment: Gait belt Activity Tolerance: Patient limited by pain Patient left: in chair;with call bell/phone within reach;with family/visitor present     Time: 1411-1434 PT Time Calculation (min): 23 min  Charges:  $Gait Training: 8-22 mins $Therapeutic Activity: 8-22 mins                    G Codes:      Ferman Hamming 04/10/2013, 3:32 PM Weldon Picking PT Acute  Rehab Services [phone] Beeper [phone]

## 2013-04-10 NOTE — Progress Notes (Signed)
Occupational Therapy Evaluation and Discharge Patient Details Name: Danielle Ferguson MRN: [id number] DOB: [birth year] Today's Date: 04/10/2013    History of Present Illness R TKA   Clinical Impression   PTA pt lived at home with sons and was independent in ADLs and mobility. Pt with Hx of L TKA. Pt was received sitting in recliner and was resistant to working with therapy. OT educated pt on role of therapy in healing process and importance of ambulation/mobility for healing as well as OT role. Pt exhibited regressive behavior (covering head with sheet, groaning, rolling/closing eyes) and declined OOB activities including tub or toilet transfer. Due to limited observation of pt's skills and risk for fall (per PT, R knee buckles with ambulation) feel that pt would benefit from skilled OT services at SNF. All further OT concerns will be met at the next venue of care.     Follow Up Recommendations  SNF;Supervision/Assistance - 24 hour    Equipment Recommendations  None recommended by OT       Precautions / Restrictions Precautions Precautions: Knee;Fall Restrictions Weight Bearing Restrictions: Yes RLE Weight Bearing: Weight bearing as tolerated              ADL Eating/Feeding: Independent;Sitting Grooming: Set up;Sitting   Upper Body Dressing : Set up;Sitting             General ADL Comments: Pt declined activities OOB and was resistant to OT. OT educated pt on role of therapy in recovery process and importance of ambulation for recovery, however pt declined. Pt exhibiting regressive behavior (covering head with blankets, groaning, rolling and closing eyes). Provided education for LB ADLs using compensatory technique and demonstrated tub transfer method.                Pertinent Vitals/Pain Pt groaning as OT provided education and c/o pain 9/10. RN notified.      Hand Dominance Right   Extremity/Trunk Assessment Upper Extremity Assessment Upper Extremity  Assessment: Overall WFL for tasks assessed   Lower Extremity Assessment Lower Extremity Assessment: Defer to PT evaluation       Communication Communication Communication: No difficulties;Prefers language other than English (Arabic- caregiver (son) present as interpreter)   Cognition Arousal/Alertness: Awake/alert Behavior During Therapy: WFL for tasks assessed/performed Overall Cognitive Status: Within Functional Limits for tasks assessed                             Home Living Family/patient expects to be discharged to:: Private residence Living Arrangements: Children;Other relatives Available Help at Discharge: Family;Available 24 hours/day Type of Home: House Home Access: Stairs to enter [employer] of Steps: 3-5 Entrance Stairs-Rails: None Home Layout: Two level;1/2 bath on main level;Able to live on main level with bedroom/bathroom     Bathroom Shower/Tub: Tub/shower unit Shower/tub characteristics: Architectural technologist: Standard     Home Equipment: Environmental consultant - 2 wheels;Bedside commode          Prior Functioning/Environment Level of Independence: Independent             OT Diagnosis:  Generalized weakness, Acute pain.   OT Problem List: Decreased strength;Decreased range of motion;Decreased activity tolerance;Impaired balance (sitting and/or standing);Decreased safety awareness;Decreased knowledge of use of DME or AE;Decreased knowledge of precautions;Pain                    End of Session:   Activity Tolerance: Other (comment) (Pt resistant to therapy and unwilling to  get OOB or participate in simulated activities in recliner) Patient left: in chair;with call bell/phone within reach;with family/visitor present   Time: 7373-6681 OT Time Calculation (min): 17 min Charges:  OT General Charges $OT Visit: 1 Procedure OT Evaluation $Initial OT Evaluation Tier I: 1 Procedure OT Treatments $Self Care/Home Management : 8-22  mins  Juluis Rainier [phone] 04/10/2013, 3:13 PM

## 2013-04-10 NOTE — Progress Notes (Signed)
Patient and [facility]'s [facility] Clinical Social Worker will sign off for now as social work intervention is no longer needed. Please consult us again if new need arises.   Sabino NiemannAmy Mayola Mcbain, MSW, [employer]CSWA [phone]

## 2013-04-10 NOTE — Progress Notes (Signed)
Patient ID: Danielle Ferguson, female   DOB: October 13, 1950, 63 y.o.   MRN: [id number]     Subjective:  Patient reports pain as mild to moderate.  Patient complains of pain in her throat.  Objective:   VITALS:   Filed Vitals:   04/09/13 2000 04/09/13 2041 04/10/13 0000 04/10/13 0400  BP:  136/59    Pulse:  97    Temp:  98.1 F (36.7 C)    TempSrc:  Oral    Resp: 20 22 20 20   Weight:      SpO2: 94% 95% 95% 95%    ABD soft Sensation intact distally Dorsiflexion/Plantar flexion intact Incision: dressing C/D/I and no drainage EHL/ FHL firing patient does not bend the knee on her own Ankle and foot function within normal limits Wound clean and dry no sign of infection. Lab Results  Component Value Date   WBC 11.5* 04/10/2013   HGB 10.0* 04/10/2013   HCT 29.7* 04/10/2013   MCV 82.3 04/10/2013   PLT 197 04/10/2013     Assessment/Plan: 2 Days Post-Op   Principal Problem:   Osteoarthritis of right knee Active Problems:   Knee osteoarthritis   Advance diet Up with therapy WBAT Dry dressing PRN Plan for DC tomorrow  Haskel KhanDOUGLAS PARRY, BRANDON 04/10/2013, 7:38 AM  Seen and agree with above.  Throat irritation probably from the intubation.   Danielle LucyJoshua Senora Lacson, MD Cell [phone]

## 2013-04-11 ENCOUNTER — Encounter (HOSPITAL_COMMUNITY): Payer: Self-pay | Admitting: Orthopedic Surgery

## 2013-04-11 LAB — CBC
HCT: 30 % — ABNORMAL LOW (ref 36.0–46.0)
Hemoglobin: 9.9 g/dL — ABNORMAL LOW (ref 12.0–15.0)
MCH: 27.4 pg (ref 26.0–34.0)
MCHC: 33 g/dL (ref 30.0–36.0)
MCV: 83.1 fL (ref 78.0–100.0)
PLATELETS: 179 10*3/uL (ref 150–400)
RBC: 3.61 MIL/uL — ABNORMAL LOW (ref 3.87–5.11)
RDW: 14.1 % (ref 11.5–15.5)
WBC: 8.2 10*3/uL (ref 4.0–10.5)

## 2013-04-11 LAB — GLUCOSE, CAPILLARY
GLUCOSE-CAPILLARY: 182 mg/dL — AB (ref 70–99)
GLUCOSE-CAPILLARY: 178 mg/dL — AB (ref 70–99)
Glucose-Capillary: 147 mg/dL — ABNORMAL HIGH (ref 70–99)
Glucose-Capillary: 188 mg/dL — ABNORMAL HIGH (ref 70–99)

## 2013-04-11 NOTE — Progress Notes (Signed)
Physical Therapy Treatment Patient Details Name: Crissie FiguresMonifah Wolfert MRN: [id number] DOB: 1951/01/12 Today's Date: 04/11/2013    History of Present Illness R TKA    PT Comments    POD # 3 am session.  Pt progressing slowly, issues of pain control and self limiting.  Requires MAX encouragement to participate.  Family present to interpret.  Attempted to amb but was only able to partially stand due to MAX c/o knee pain and L hand pain at IV site as she indicated it hurt when she pushes up from recliner and when using walker. Pt also c/o of dizzyness.  BP 191/76.  RN in room and aware.  Also notified pt needed pain meds.  Returned to recliner and plan to return when meds have taken effect.   Follow Up Recommendations  Home health PT;Supervision/Assistance - 24 hour     Equipment Recommendations  None recommended by PT    Recommendations for Other Services       Precautions / Restrictions Precautions Precautions: Knee;Fall Restrictions Weight Bearing Restrictions: Yes RLE Weight Bearing: Weight bearing as tolerated    Mobility  Bed Mobility               General bed mobility comments: Pt OOB in recliner  Transfers Overall transfer level: Needs assistance Equipment used: Rolling walker (2 wheeled) Transfers: Sit to/from Stand Sit to Stand: Mod assist;Max assist         General transfer comment: visual cues for proper handplacement and increased time.  Pt c/o pain L hand IV site/location makes it difficult to use L UE pushing up from recliner.    Ambulation/Gait         Gait velocity: unable do to MAX c/o pain and "head going around" via family interpreter.        Stairs            Wheelchair Mobility    Modified Rankin (Stroke Patients Only)       Balance                                    Cognition                            Exercises      General Comments        Pertinent Vitals/Pain     Home Living                       Prior Function            PT Goals (current goals can now be found in the care plan section) Progress towards PT goals: Progressing toward goals    Frequency  7X/week    PT Plan      End of Session Equipment Utilized During Treatment: Gait belt;Right knee immobilizer Activity Tolerance: Patient limited by pain;Other (comment) (hyperetension) Patient left: in chair;with call bell/phone within reach;with family/visitor present     Time: 1205-1218 PT Time Calculation (min): 13 min  Charges:  $Therapeutic Activity: 8-22 mins                    G Codes:      Felecia ShellingLori Shweta Aman  PTA [facility]  Acute  Rehab Pager      [phone]

## 2013-04-11 NOTE — Progress Notes (Addendum)
Patient ID: Danielle Ferguson, female   DOB: 1950-02-13, 63 y.o.   MRN: [id number]     Subjective:  Patient reports pain as mild to moderate.  Patient and her son would like her to stay another night .  Patient is alert and follows commands.  Objective:   VITALS:   Filed Vitals:   04/10/13 0921 04/10/13 1300 04/10/13 1924 04/11/13 0534  BP: 193/83 176/62 170/69 157/59  Pulse:  109 106 106  Temp:  98.8 F (37.1 C) 98.3 F (36.8 C) 98.3 F (36.8 C)  TempSrc:   Oral Oral  Resp:  20 22 22   Weight:      SpO2:  90% 92% 92%    ABD soft Sensation intact distally Dorsiflexion/Plantar flexion intact Incision: dressing C/D/I and no drainage Patient has pain with motion of the knee and will only move the knee and leg from side to side.  She will not bend the knee.  There is no sign of infection.  Lab Results  Component Value Date   WBC 8.2 04/11/2013   HGB 9.9* 04/11/2013   HCT 30.0* 04/11/2013   MCV 83.1 04/11/2013   PLT 179 04/11/2013     Assessment/Plan: 3 Days Post-Op   Principal Problem:   Osteoarthritis of right knee Active Problems:   Knee osteoarthritis   Advance diet Up with therapy Plan for discharge tomorrow WBAT Dry dressing PRN ABLA stable   DOUGLAS Janace LittenRRY, BRANDON 04/11/2013, 7:22 AM  Seen and agree with above.  Will also add CPM, slow with moving.  Teryl LucyJoshua Mister Krahenbuhl, MD Cell [phone]

## 2013-04-11 NOTE — Discharge Summary (Signed)
Physician Discharge Summary  Patient ID: Danielle Ferguson MRN: [id number] DOB/AGE: 10/11/1950 63 y.o.  Admit date: 04/08/2013 Discharge date: 04/12/2013  Admission Diagnoses:  Osteoarthritis of right knee  Discharge Diagnoses:  Principal Problem:   Osteoarthritis of right knee Active Problems:   Knee osteoarthritis   Past Medical History  Diagnosis Date  . Hypertension   . High cholesterol   . Thyroid disease   . Chronic knee pain   . Shortness of breath     with walking  . Headache(784.0)   . Constipation   . HTN (hypertension) 01/10/2013  . PONV (postoperative nausea and vomiting)   . Hypothyroidism   . Type II diabetes mellitus   . Arthritis     "back; knees" (04/10/2013)  . Chronic radicular pain of lower back     "right side" (04/10/2013)    Surgeries: Procedure(s): TOTAL KNEE ARTHROPLASTY on 04/08/2013   Consultants (if any):    Discharged Condition: Improved  Hospital Course: Danielle Ferguson is an 63 y.o. female who was admitted 04/08/2013 with a diagnosis of Osteoarthritis of right knee and went to the operating room on 04/08/2013 and underwent the above named procedures.    She was given perioperative antibiotics:  Anti-infectives   Start     Dose/Rate Route Frequency Ordered Stop   04/08/13 1600  ceFAZolin (ANCEF) IVPB 2 g/50 mL premix     2 g 100 mL/hr over 30 Minutes Intravenous Every 6 hours 04/08/13 1513 04/08/13 2303   04/08/13 0600  ceFAZolin (ANCEF) IVPB 2 g/50 mL premix     2 g 100 mL/hr over 30 Minutes Intravenous On call to O.R. 04/07/13 1419 04/08/13 0753    .  She was given sequential compression devices, early ambulation, and lovenox for DVT prophylaxis.  She benefited maximally from the hospital stay and there were no complications.    Recent vital signs:  Filed Vitals:   04/11/13 0534  BP: 157/59  Pulse: 106  Temp: 98.3 F (36.8 C)  Resp: 22    Recent laboratory studies:  Lab Results  Component Value Date   HGB 9.9*  04/11/2013   HGB 10.0* 04/10/2013   HGB 11.1* 04/09/2013   Lab Results  Component Value Date   WBC 8.2 04/11/2013   PLT 179 04/11/2013   Lab Results  Component Value Date   INR 1.04 04/02/2013   Lab Results  Component Value Date   NA 133* 04/09/2013   K 4.9 04/09/2013   CL 96 04/09/2013   CO2 24 04/09/2013   BUN 25* 04/09/2013   CREATININE 0.74 04/09/2013   GLUCOSE 307* 04/09/2013    Discharge Medications:     Medication List    STOP taking these medications       TYLENOL PO      TAKE these medications       aspirin 81 MG tablet  Take 81 mg by mouth daily.     docusate sodium 100 MG capsule  Commonly known as:  COLACE  Take 100 mg by mouth 2 (two) times daily.     enoxaparin 30 MG/0.3ML injection  Commonly known as:  LOVENOX  Inject 0.3 mLs (30 mg total) into the skin every 12 (twelve) hours.     glimepiride 2 MG tablet  Commonly known as:  AMARYL  Take 1 tablet (2 mg total) by mouth daily before breakfast.     Insulin Glargine 100 UNIT/ML Solostar Pen  Commonly known as:  LANTUS SOLOSTAR  Take 34 units in the  morning and 24 units at bedtime.     levothyroxine 100 MCG tablet  Commonly known as:  SYNTHROID, LEVOTHROID  Take 1 tab by mouth daily on Mon, Wed, Fri, and 1/2 tab other days.     lisinopril-hydrochlorothiazide 20-12.5 MG per tablet  Commonly known as:  PRINZIDE,ZESTORETIC  Take 1 tablet by mouth daily. PATIENT NEEDS OFFICE VISIT FOR ADDITIONAL REFILLS     metFORMIN 1000 MG tablet  Commonly known as:  GLUCOPHAGE  Take 1 tablet (1,000 mg total) by mouth 2 (two) times daily with a meal.     methocarbamol 500 MG tablet  Commonly known as:  ROBAXIN  Take 1 tablet (500 mg total) by mouth 4 (four) times daily.     oxyCODONE-acetaminophen 10-325 MG per tablet  Commonly known as:  PERCOCET  Take 1-2 tablets by mouth every 6 (six) hours as needed for pain. MAXIMUM TOTAL ACETAMINOPHEN DOSE IS 4000 MG PER DAY     promethazine 25 MG tablet  Commonly known as:   PHENERGAN  Take 1 tablet (25 mg total) by mouth every 6 (six) hours as needed for nausea or vomiting.     sennosides-docusate sodium 8.6-50 MG tablet  Commonly known as:  SENOKOT-S  Take 2 tablets by mouth daily.     simvastatin 40 MG tablet  Commonly known as:  ZOCOR  TAKE 1 TABLET (40 MG TOTAL) BY MOUTH DAILY.        Diagnostic Studies: Dg Knee Right Port  04/08/2013   CLINICAL DATA:  Post knee arthroplasty  EXAM: PORTABLE RIGHT KNEE - 1-2 VIEW  COMPARISON:  None.  FINDINGS: Two views of the right knee submitted. There is right knee prosthesis in anatomic alignment. Postsurgical changes are noted with periarticular soft tissue air.  IMPRESSION: Right knee prosthesis in anatomic alignment. Postsurgical changes are noted.   Electronically Signed   By: Natasha Mead M.D.   On: 04/08/2013 10:55    Disposition: 03-Skilled Nursing Facility      Discharge Orders   Future Orders Complete By Expires   Call MD / Call 911  As directed    Comments:     If you experience chest pain or shortness of breath, CALL 911 and be transported to the hospital emergency room.  If you develope a fever above 101 F, pus (white drainage) or increased drainage or redness at the wound, or calf pain, call your surgeon's office.   Change dressing  As directed    Comments:     Change dressing in three days, then change the dressing daily with sterile 4 x 4 inch gauze dressing.  You may clean the incision with alcohol prior to redressing.   Constipation Prevention  As directed    Comments:     Drink plenty of fluids.  Prune juice may be helpful.  You may use a stool softener, such as Colace (over the counter) 100 mg twice a day.  Use MiraLax (over the counter) for constipation as needed.   Diet general  As directed    Discharge instructions  As directed    Comments:     Change dressing in 3 days and reapply fresh dressing, unless you have a splint (half cast).  If you have a splint/cast, just leave in place until  your follow-up appointment.    Keep wounds dry for 3 weeks.  Leave steri-strips in place on skin.  Do not apply lotion or anything to the wound.   Do not put a pillow under  the knee. Place it under the heel.  As directed    TED hose  As directed    Comments:     Use stockings (TED hose) for 2 weeks on both leg(s).  You may remove them at night for sleeping.   Weight bearing as tolerated  As directed    Questions:     Laterality:     Extremity:        Follow-up Information   Follow up with Eulas PostLANDAU,Feliciana Narayan P, MD. Schedule an appointment as soon as possible for a visit in 2 weeks.   Specialty:  Orthopedic Surgery   Contact information:   [address] [place] [address] [phone]       Follow up with [facility]. (Someone from [facility] will contact you concerning physical therapy start date and time.)    Contact information:   [address] [place] [address] [phone]        Signed: Eulas PostLANDAU,Sakeena Teall P 04/11/2013, 7:34 AM

## 2013-04-11 NOTE — Progress Notes (Signed)
Physical Therapy Treatment Patient Details Name: Danielle FiguresMonifah Ferguson MRN: [id number] DOB: September 10, 1950 Today's Date: 04/11/2013    History of Present Illness R TKA    PT Comments    POD # 3 and pt has yet to amb in the hallway.  Requires MAX encouragement to participate despite c/o pain (pre medicated), dizziness (BP WNL) and pain L hand at IV site esp when pushing.  Applied KI as pt was unable to perform SLR then assisted out of bed to amb to BR.  [facility] however strongly encouraged her to "walk to bathroom".  Assisted in bathroom then amb back to bed.  Pt required increased time and MAX encouragement to increase self assist.   Will need to practice steps tomorrow .   Follow Up Recommendations  Home health PT;Supervision/Assistance - 24 hour     Equipment Recommendations  None recommended by PT    Recommendations for Other Services       Precautions / Restrictions Precautions Precautions: Knee;Fall Precaution Comments: instructed family on KI use for amb Required Braces or Orthoses: Knee Immobilizer - Right Restrictions Weight Bearing Restrictions: No RLE Weight Bearing: Weight bearing as tolerated    Mobility  Bed Mobility Overal bed mobility: Needs Assistance Bed Mobility: Supine to Sit;Sit to Supine     Supine to sit: Min assist;Mod assist Sit to supine: Min assist;Mod assist   General bed mobility comments: assist with R LE off/on bed with increased time and MAX encouragement  Transfers Overall transfer level: Needs assistance Equipment used: Rolling walker (2 wheeled) Transfers: Sit to/from Stand Sit to Stand: Min assist;Mod assist         General transfer comment: visual cueing on proper tech, hand placement and turn completion prior to sit.  Assisted OOB and on/off toilet.   Ambulation/Gait Ambulation/Gait assistance: Min assist Ambulation Distance (Feet): 20 Feet (10' x 2 to and from BR) Assistive device: Rolling walker (2 wheeled) Gait  Pattern/deviations: Step-to pattern;Decreased stance time - right;Trunk flexed;Decreased step length - right;Decreased step length - left Gait velocity: decreased   General Gait Details: Pt required MAX encouragment to increase amb distance despite c/o knee pain(pre medicated) and "dizzyness"(BP WNL) and c/o L hand pain (IV site).  Pt has yet to amb in hallway. Pt demon limited activity tolerance and low motivation.   Stairs            Wheelchair Mobility    Modified Rankin (Stroke Patients Only)       Balance                                    Cognition                            Exercises      General Comments        Pertinent Vitals/Pain C/o knee pain (pre medicated0    Home Living                      Prior Function            PT Goals (current goals can now be found in the care plan section) Progress towards PT goals: Progressing toward goals    Frequency  7X/week    PT Plan      End of Session Equipment Utilized During Treatment: Gait belt;Right knee immobilizer  Activity Tolerance: Patient limited by pain Patient left: in bed;with call bell/phone within reach;with family/visitor present     Time: 1335-1400 PT Time Calculation (min): 25 min  Charges:  $Gait Training: 8-22 mins $Therapeutic Activity: 8-22 mins                    G Codes:      Danielle Ferguson  PTA [facility]  Acute  Rehab Pager      [phone]

## 2013-04-11 NOTE — Progress Notes (Signed)
Physical Therapy Treatment Patient Details Name: Danielle FiguresMonifah Ferguson MRN: [id number] DOB: 1950/08/30 Today's Date: 04/11/2013    History of Present Illness R TKA    PT Comments    POD # 3 with RN assisted pt OOB to amb to BR then back to bed.  Strongly encouraging pt to "walk" to BR vs use bed pan as she requests.  Pt c/o dizziness and headache.  Also requesting something to help her move her bowels.  RN in room and aware.   MD ordered CPM  Will need to practice steps tomorrow before D/C.   Follow Up Recommendations  Home health PT;Supervision/Assistance - 24 hour     Equipment Recommendations  None recommended by PT    Recommendations for Other Services       Precautions / Restrictions Precautions Precautions: Knee;Fall Precaution Comments: instructed family on KI use for amb Required Braces or Orthoses: Knee Immobilizer - Right Restrictions Weight Bearing Restrictions: No RLE Weight Bearing: Weight bearing as tolerated    Mobility  Bed Mobility Overal bed mobility: Needs Assistance Bed Mobility: Supine to Sit;Sit to Supine     Supine to sit: Min assist;Mod assist Sit to supine: Min assist;Mod assist   General bed mobility comments: assist with R LE off/on bed with increased time and MAX encouragement  Transfers Overall transfer level: Needs assistance Equipment used: Rolling walker (2 wheeled) Transfers: Sit to/from Stand Sit to Stand: Min assist;Mod assist         General transfer comment: visual cueing on proper tech, hand placement and turn completion prior to sit.  Assisted OOB and on/off toilet.   Ambulation/Gait Ambulation/Gait assistance: Min assist Ambulation Distance (Feet): 20 Feet (10' x 2 to and from BR) Assistive device: Rolling walker (2 wheeled) Gait Pattern/deviations: Step-to pattern;Decreased stance time - right;Trunk flexed;Decreased step length - right;Decreased step length - left Gait velocity: decreased   General Gait Details: Pt  required MAX encouragment to increase amb distance despite c/o knee pain(pre medicated) and "dizzyness"(BP WNL) and c/o L hand pain (IV site).  Pt has yet to amb in hallway. Pt demon limited activity tolerance and low motivation.   Stairs            Wheelchair Mobility    Modified Rankin (Stroke Patients Only)       Balance                                    Cognition                            Exercises      General Comments        Pertinent Vitals/Pain     Home Living                      Prior Function            PT Goals (current goals can now be found in the care plan section) Progress towards PT goals: Progressing toward goals    Frequency  7X/week    PT Plan      End of Session Equipment Utilized During Treatment: Gait belt;Right knee immobilizer Activity Tolerance: Patient limited by pain Patient left: in bed;with call bell/phone within reach;with family/visitor present     Time: [id number] PT Time Calculation (min): 16 min  Charges:  $Gait Training: 8-22 mins $Therapeutic  Activity: 8-22 mins                    G Codes:      Danielle ShellingLori Iam Ferguson  PTA [facility]  Acute  Rehab Pager      [phone]

## 2013-04-11 NOTE — Progress Notes (Signed)
Orthopedic Tech Progress Note Patient Details:  Crissie FiguresMonifah Gervasi [birth year] [id number]  CPM Right Knee CPM Right Knee: On Right Knee Flexion (Degrees): 60 Right Knee Extension (Degrees): 0 Additional Comments: put ohf on bed   Jennye MoccasinHughes, Tresea Heine Craig 04/11/2013, 5:10 PM

## 2013-04-11 NOTE — Progress Notes (Signed)
Patient ID: Danielle Ferguson, female   DOB: 10-29-1950, [age] y.o.   MRN: [id number]

## 2013-04-12 LAB — GLUCOSE, CAPILLARY
GLUCOSE-CAPILLARY: 155 mg/dL — AB (ref 70–99)
Glucose-Capillary: 236 mg/dL — ABNORMAL HIGH (ref 70–99)

## 2013-04-12 NOTE — Progress Notes (Signed)
Subjective: 4 Days Post-Op Procedure(s) (LRB): TOTAL KNEE ARTHROPLASTY (Right) Patient reports pain as 1 on 0-10 scale.  Patient reports very minimal pain this morning.  She does admit to some lightheadedness, nausea and vomiting.  Positive flatus but no bm as of yet.  Tolerating diet.  She has starting using CPM machine and is doing well with that.  Objective: Vital signs in last 24 hours: Temp:  [98.5 F (36.9 C)-98.7 F (37.1 C)] 98.7 F (37.1 C) (03/27 2210) Pulse Rate:  [99-107] 107 (03/27 2210) Resp:  [18] 18 (03/27 2210) BP: (113-191)/(57-77) 113/77 mmHg (03/27 2210) SpO2:  [93 %-94 %] 94 % (03/27 2210)  Intake/Output from previous day: 03/27 0701 - 03/28 0700 In: 360 [P.O.:360] Out: -  Intake/Output this shift:     Recent Labs  04/09/13 0605 04/10/13 0615 04/11/13 0530  HGB 11.1* 10.0* 9.9*    Recent Labs  04/10/13 0615 04/11/13 0530  WBC 11.5* 8.2  RBC 3.61* 3.61*  HCT 29.7* 30.0*  PLT 197 179    Recent Labs  04/09/13 0605  NA 133*  K 4.9  CL 96  CO2 24  BUN 25*  CREATININE 0.74  GLUCOSE 307*  CALCIUM 8.4   No results found for this basename: LABPT, INR,  in the last 72 hours  Neurologically intact ABD soft Neurovascular intact Sensation intact distally Intact pulses distally Dorsiflexion/Plantar flexion intact Compartment soft No drainage noted through dressing  Assessment/Plan: 4 Days Post-Op Procedure(s) (LRB): TOTAL KNEE ARTHROPLASTY (Right) Advance diet Up with therapy Discharge home with home health WBAT Dry dressing change prn  ANTON, M. LINDSEY 04/12/2013, 5:51 AM

## 2013-04-12 NOTE — Progress Notes (Signed)
Physical Therapy Treatment Patient Details Name: Danielle Ferguson MRN: [id number] DOB: 06/16/1950 Today's Date: 04/12/2013    History of Present Illness R TKA    PT Comments    Pt and son educated on stair's for home, hand out provided as well. Pt needed less encouragement this session, more motivated to work with therapy. Did report fatigue after session.  Follow Up Recommendations  Home health PT;Supervision/Assistance - 24 hour     Equipment Recommendations  None recommended by PT    Recommendations for Other Services OT consult     Precautions / Restrictions Precautions Precautions: Knee;Fall Precaution Comments: Reinforced knee education of no pillow under knee Required Braces or Orthoses: Knee Immobilizer - Right Restrictions Weight Bearing Restrictions: Yes RLE Weight Bearing: Weight bearing as tolerated    Mobility  Bed Mobility Overal bed mobility: Needs Assistance Bed Mobility: Supine to Sit     Supine to sit: Min guard;Min assist     General bed mobility comments: pt able to sit self straight up in bed and then needed minimal to no assist to move right leg toward edge of bed. min assist for support while pt scooted to edge of bed.   Transfers Overall transfer level: Needs assistance Equipment used: Rolling walker (2 wheeled) Transfers: Sit to/from [employer];Stand Pivot Transfers Sit to Stand: Min guard Stand pivot transfers: Min guard       General transfer comment: cues on hand and RLE placement with transfers x2. No physical assistance needed. min guard assist with stand pivot from bed to recliner with RW and cues on sequence with transfer.  Ambulation/Gait Ambulation/Gait assistance: Min guard Ambulation Distance (Feet): 10 Feet (10 feet x3 reps) Assistive device: Rolling walker (2 wheeled) Gait Pattern/deviations: Step-to pattern;Antalgic;Trunk flexed Gait velocity: decreased Gait velocity interpretation: Below normal speed for  age/gender General Gait Details: cues on sequence and posture with gait. No physical assistance needed with gait. No knee buckling with gait without KI today. Pt did complain of discomfort with IV in hand with weight bearing through hand on walker.   Stairs Stairs: Yes Stairs assistance: Min assist Stair Management: No rails;Step to pattern;Backwards;With walker Number of Stairs: 3 General stair comments: demo'd technique to pt/son. Son reports this is the techique pt used with last knee replacement. Son held walker for pt practice with cues on proper sequence. Good demo up stairs with correct sequence, however coming down pt wanting to lead with left leg and lower with right. Educated that this was not the optimal/correct sequence due to the right knee surgery. Pt argumentative that this is how she did it last time. Explained that last time her left knee had the surgery and that was then the correct sequence, however now we need to change to accomodate the right knee surgery. Pt adamant to go her way until she tried it and had increased pain, more agreeable to correct sequence afterwards.                                 Wheelchair Mobility    Modified Rankin (Stroke Patients Only)       Balance                                    Cognition Arousal/Alertness: Awake/alert Behavior During Therapy: Agitated;WFL for tasks assessed/performed (agitated with going down stairs) Overall Cognitive Status: Within Functional  Limits for tasks assessed                      Exercises Total Joint Exercises Ankle Circles/Pumps: AROM;Both;10 reps;Supine Quad Sets: AROM;Both;10 reps;Supine Heel Slides: AAROM;Strengthening;Right;10 reps;Supine Straight Leg Raises: AAROM;Strengthening;Right;10 reps;Supine     Pertinent Vitals/Pain Reported pain as 4-5/10 with session. Was premedicated.     PT Goals (current goals can now be found in the care plan section) Acute Rehab PT  Goals Patient Stated Goal: None stated PT Goal Formulation: With patient Time For Goal Achievement: 04/16/13 Potential to Achieve Goals: Good Progress towards PT goals: Progressing toward goals    Frequency  7X/week    PT Plan Current plan remains appropriate    End of Session Equipment Utilized During Treatment: Gait belt Activity Tolerance: Patient tolerated treatment well;Patient limited by pain Patient left: in chair;with family/visitor present;with call bell/phone within reach     Time: 0912-0940 PT Time Calculation (min): 28 min  Charges:  $Gait Training: 8-22 mins $Therapeutic Exercise: 8-22 mins                    G Codes:      Sallyanne Kuster 04/23/2013, 9:53 AM  Sallyanne Kuster, PTA Office- [phone]

## 2013-04-23 ENCOUNTER — Telehealth: Payer: Self-pay

## 2013-04-23 NOTE — Telephone Encounter (Signed)
DONNA FROM [facility] STATES PT HAD A TOTAL KNEE REPLACEMENT LAST WEEK AND IS COMPLAINING ABOUT VERTIGO SHE IS HAVING WOULD LIKE TO SPEAK WITH SOMEONE ABOUT IT PLEASE CALL [phone]

## 2013-04-24 NOTE — Telephone Encounter (Signed)
lmom to cb. 

## 2013-04-28 ENCOUNTER — Ambulatory Visit (INDEPENDENT_AMBULATORY_CARE_PROVIDER_SITE_OTHER): Payer: Managed Care, Other (non HMO) | Admitting: Family Medicine

## 2013-04-28 ENCOUNTER — Other Ambulatory Visit: Payer: Self-pay | Admitting: Family Medicine

## 2013-04-28 VITALS — BP 130/70 | HR 113 | Temp 97.9°F | Ht 59.0 in | Wt 193.0 lb

## 2013-04-28 DIAGNOSIS — E039 Hypothyroidism, unspecified: Secondary | ICD-10-CM

## 2013-04-28 DIAGNOSIS — R42 Dizziness and giddiness: Secondary | ICD-10-CM

## 2013-04-28 DIAGNOSIS — I10 Essential (primary) hypertension: Secondary | ICD-10-CM

## 2013-04-28 DIAGNOSIS — E785 Hyperlipidemia, unspecified: Secondary | ICD-10-CM

## 2013-04-28 DIAGNOSIS — E119 Type 2 diabetes mellitus without complications: Secondary | ICD-10-CM

## 2013-04-28 LAB — POCT CBC
GRANULOCYTE PERCENT: 60.4 % (ref 37–80)
HCT, POC: 36.9 % — AB (ref 37.7–47.9)
Hemoglobin: 11.5 g/dL — AB (ref 12.2–16.2)
Lymph, poc: 3.1 (ref 0.6–3.4)
MCH: 26.5 pg — AB (ref 27–31.2)
MCHC: 31.2 g/dL — AB (ref 31.8–35.4)
MCV: 85 fL (ref 80–97)
MID (cbc): 0.6 (ref 0–0.9)
MPV: 10.4 fL (ref 0–99.8)
PLATELET COUNT, POC: 379 10*3/uL (ref 142–424)
POC Granulocyte: 5.7 (ref 2–6.9)
POC LYMPH PERCENT: 33.4 %L (ref 10–50)
POC MID %: 6.2 % (ref 0–12)
RBC: 4.34 M/uL (ref 4.04–5.48)
RDW, POC: 16.1 %
WBC: 9.4 10*3/uL (ref 4.6–10.2)

## 2013-04-28 LAB — POCT GLYCOSYLATED HEMOGLOBIN (HGB A1C): Hemoglobin A1C: 7.6

## 2013-04-28 LAB — GLUCOSE, POCT (MANUAL RESULT ENTRY): POC Glucose: 86 mg/dl (ref 70–99)

## 2013-04-28 MED ORDER — INSULIN GLARGINE 100 UNIT/ML SOLOSTAR PEN: PEN_INJECTOR | SUBCUTANEOUS | Status: DC

## 2013-04-28 MED ORDER — MECLIZINE HCL 12.5 MG PO TABS: ORAL_TABLET | ORAL | Status: DC

## 2013-04-28 MED ORDER — GLIMEPIRIDE 2 MG PO TABS: 2.0000 mg | ORAL_TABLET | Freq: Every day | ORAL | Status: DC

## 2013-04-28 MED ORDER — LISINOPRIL-HYDROCHLOROTHIAZIDE 20-12.5 MG PO TABS: 1.0000 | ORAL_TABLET | Freq: Every day | ORAL | Status: DC

## 2013-04-28 MED ORDER — METFORMIN HCL 1000 MG PO TABS: 1000.0000 mg | ORAL_TABLET | Freq: Two times a day (BID) | ORAL | Status: DC

## 2013-04-28 MED ORDER — SIMVASTATIN 40 MG PO TABS: 40.0000 mg | ORAL_TABLET | Freq: Every day | ORAL | Status: DC

## 2013-04-28 MED ORDER — LEVOTHYROXINE SODIUM 100 MCG PO TABS: ORAL_TABLET | ORAL | Status: DC

## 2013-04-28 NOTE — Progress Notes (Signed)
Subjective: Patient had a recent right total knee by Dr. Dion SaucierLandau. That was a month ago. For the last couple of weeks she's been feeling dizzy. She doesn't check her sugars very often she is diabetic. She is doing fairly well in her we have apparently. Her son translated for her. She is from [place]. She is on a number of other medications also return listed. She has not had a lot of pain medicine any longer. Mild headache. She gets some nausea but no other chest pain. Noexcessive swelling. No other major complaints.  Objective: Obese lady who does not speak [place]. Eyes PERRLA. Fundi benign as best I could see them with a lighted room. Her throat was clear. Neck supple without nodes or thyromegaly. No carotid bruits. Chest clear. Heart regular without murmurs. Examined seated in a chair. Her knee looks like he'll do well. No major leg tenderness or edema.  EKG normal  Results for orders placed in visit on 04/28/13  POCT CBC      Result Value Ref Range   WBC 9.4  4.6 - 10.2 K/uL   Lymph, poc 3.1  0.6 - 3.4   POC LYMPH PERCENT 33.4  10 - 50 %L   MID (cbc) 0.6  0 - 0.9   POC MID % 6.2  0 - 12 %M   POC Granulocyte 5.7  2 - 6.9   Granulocyte percent 60.4  37 - 80 %G   RBC 4.34  4.04 - 5.48 M/uL   Hemoglobin 11.5 (*) 12.2 - 16.2 g/dL   HCT, POC 16.136.9 (*) 09.637.7 - 47.9 %   MCV 85.0  80 - 97 fL   MCH, POC 26.5 (*) 27 - 31.2 pg   MCHC 31.2 (*) 31.8 - 35.4 g/dL   RDW, POC 04.516.1     Platelet Count, POC 379  142 - 424 K/uL   MPV 10.4  0 - 99.8 fL  GLUCOSE, POCT (MANUAL RESULT ENTRY)      Result Value Ref Range   POC Glucose 86  70 - 99 mg/dl  POCT GLYCOSYLATED HEMOGLOBIN (HGB A1C)      Result Value Ref Range   Hemoglobin A1C 7.6       Assessment: Dizziness Diabetes Hypertension Status post total knee  Plan: Wrote her prescriptions for one year associated take them with her to [place]. Antivert 12.5 4 times a day when necessary Return if worse. I am not sure exactly why she is  dizzy. Urged her to drink more water.

## 2013-04-28 NOTE — Telephone Encounter (Signed)
Language barrier, seems to be constant and or with movement.  Again, there is a language barrier with the children.  The home nurse suggested the son make an appt. For his mother, however the nurse still feels as though this advise is not understood.  I explained since this is a new problem and the pt has not been seen since January she would need to come in for an evaluation. Lupita LeashDonna stated the Physical therapist would likely be calling later this week, after they do more testing on the patient.

## 2013-04-28 NOTE — Patient Instructions (Signed)
Continue your same medications.  Take the meclizine( antivert)12.5 mg one every 6 hours as needed for dizziness  Drink a lot of water  If the dizziness continues please return  We sent  in prescriptions for one year worth of medicine for all of your main medications.  Discussed with the pharmacist if you can obtain all of these at bedtime for her to take with her, or if you will need to get it every 90 days and send them to her. My concern is regarding the insulin. You may need to get all of it at one time if possible and put it in with some cool packs to carry it to [place].  Return if the dizziness continues.

## 2013-04-29 ENCOUNTER — Encounter: Payer: Self-pay | Admitting: Family Medicine

## 2013-04-29 LAB — BASIC METABOLIC PANEL
BUN: 15 mg/dL (ref 6–23)
CALCIUM: 9.6 mg/dL (ref 8.4–10.5)
CO2: 28 meq/L (ref 19–32)
CREATININE: 0.73 mg/dL (ref 0.50–1.10)
Chloride: 99 mEq/L (ref 96–112)
GLUCOSE: 93 mg/dL (ref 70–99)
Potassium: 4.1 mEq/L (ref 3.5–5.3)
SODIUM: 136 meq/L (ref 135–145)

## 2013-04-29 LAB — TSH: TSH: 3.986 u[IU]/mL (ref 0.350–4.500)

## 2013-04-30 ENCOUNTER — Encounter: Payer: Self-pay | Admitting: Radiology

## 2013-05-08 ENCOUNTER — Other Ambulatory Visit: Payer: Self-pay | Admitting: Family Medicine

## 2013-05-10 ENCOUNTER — Ambulatory Visit (INDEPENDENT_AMBULATORY_CARE_PROVIDER_SITE_OTHER): Payer: Managed Care, Other (non HMO) | Admitting: Family Medicine

## 2013-05-10 VITALS — BP 138/72 | HR 108 | Temp 98.4°F | Resp 20 | Ht 59.0 in | Wt 207.0 lb

## 2013-05-10 DIAGNOSIS — M549 Dorsalgia, unspecified: Secondary | ICD-10-CM

## 2013-05-10 DIAGNOSIS — R350 Frequency of micturition: Secondary | ICD-10-CM

## 2013-05-10 DIAGNOSIS — R109 Unspecified abdominal pain: Secondary | ICD-10-CM

## 2013-05-10 LAB — POCT CBC
Granulocyte percent: 61.2 %G (ref 37–80)
HCT, POC: 38.3 % (ref 37.7–47.9)
Hemoglobin: 12 g/dL — AB (ref 12.2–16.2)
Lymph, poc: 2.7 (ref 0.6–3.4)
MCH: 26.5 pg — AB (ref 27–31.2)
MCHC: 31.3 g/dL — AB (ref 31.8–35.4)
MCV: 84.7 fL (ref 80–97)
MID (cbc): 0.5 (ref 0–0.9)
MPV: 11.3 fL (ref 0–99.8)
PLATELET COUNT, POC: 261 10*3/uL (ref 142–424)
POC Granulocyte: 5 (ref 2–6.9)
POC LYMPH %: 32.8 % (ref 10–50)
POC MID %: 6 %M (ref 0–12)
RBC: 4.52 M/uL (ref 4.04–5.48)
RDW, POC: 16.1 %
WBC: 8.1 10*3/uL (ref 4.6–10.2)

## 2013-05-10 LAB — POCT URINALYSIS DIPSTICK
BILIRUBIN UA: NEGATIVE
Blood, UA: NEGATIVE
GLUCOSE UA: NEGATIVE
KETONES UA: NEGATIVE
LEUKOCYTES UA: NEGATIVE
Nitrite, UA: NEGATIVE
Protein, UA: NEGATIVE
Spec Grav, UA: 1.01
Urobilinogen, UA: NEGATIVE
pH, UA: 6

## 2013-05-10 LAB — POCT UA - MICROSCOPIC ONLY
Bacteria, U Microscopic: NEGATIVE
Casts, Ur, LPF, POC: NEGATIVE
Crystals, Ur, HPF, POC: NEGATIVE
Mucus, UA: NEGATIVE
RBC, URINE, MICROSCOPIC: NEGATIVE
WBC, UR, HPF, POC: NEGATIVE
Yeast, UA: NEGATIVE

## 2013-05-10 MED ORDER — CYCLOBENZAPRINE HCL 5 MG PO TABS: ORAL_TABLET | ORAL | Status: DC

## 2013-05-10 MED ORDER — NAPROXEN 500 MG PO TABS: ORAL_TABLET | ORAL | Status: DC

## 2013-05-10 NOTE — Progress Notes (Signed)
Subjective: Patient is here complaining of low back pain and pain around both sides of her abdomen. This been going on for long time but has been worse the last few days, especially hurting her at night. No nausea or vomiting. She has some discomfort when she urinates. It is a little hard to get a good history from her since she does not speak [place] and the son is interpreting for her. No problems with her bowels. She says she has had some fevers.  Objective: No acute distress. She aches in her back when she moves. She is quite obese. No CVA tenderness. Is tender in the lower lumbar spine and across around the SI joint regions. Both far lateral flank areas are little tender. Abdomen is soft with mild nonspecific suprapubic tenderness. Normal bowel sounds.  Assessment: Nonspecific low abdominal and low back pain, probably muscular  Plan: Check CBC on her. Results for orders placed in visit on 05/10/13  POCT UA - MICROSCOPIC ONLY      Result Value Ref Range   WBC, Ur, HPF, POC neg     RBC, urine, microscopic neg     Bacteria, U Microscopic neg     Mucus, UA neg     Epithelial cells, urine per micros 0-4     Crystals, Ur, HPF, POC neg     Casts, Ur, LPF, POC neg     Yeast, UA neg    POCT URINALYSIS DIPSTICK      Result Value Ref Range   Color, UA yellow     Clarity, UA clear     Glucose, UA neg     Bilirubin, UA neg     Ketones, UA neg     Spec Grav, UA 1.010     Blood, UA neg     pH, UA 6.0     Protein, UA neg     Urobilinogen, UA negative     Nitrite, UA neg     Leukocytes, UA Negative    POCT CBC      Result Value Ref Range   WBC 8.1  4.6 - 10.2 K/uL   Lymph, poc 2.7  0.6 - 3.4   POC LYMPH PERCENT 32.8  10 - 50 %L   MID (cbc) 0.5  0 - 0.9   POC MID % 6.0  0 - 12 %M   POC Granulocyte 5.0  2 - 6.9   Granulocyte percent 61.2  37 - 80 %G   RBC 4.52  4.04 - 5.48 M/uL   Hemoglobin 12.0 (*) 12.2 - 16.2 g/dL   HCT, POC 16.138.3  09.637.7 - 47.9 %   MCV 84.7  80 - 97 fL   MCH, POC  26.5 (*) 27 - 31.2 pg   MCHC 31.3 (*) 31.8 - 35.4 g/dL   RDW, POC 04.516.1     Platelet Count, POC 261  142 - 424 K/uL   MPV 11.3  0 - 99.8 fL   Labs are all normal. Will treat with muscle relaxant and anti-inflammatory pain medication.  Assessment: Nonspecific abdominal wall and low back pain, probably muscular.  She remains concerned about the urinary frequency. If he gets worse she is to come back. It may be that her sugar gets high and cause is that. Advise you to avoid excessive starches.

## 2013-05-10 NOTE — Patient Instructions (Signed)
Take the naproxen one twice daily at breakfast and supper when needed for pain in the back and sides and abdomen  Take the cyclobenzaprine 5 mg one at bedtime for muscle relaxants for the back.  Apply heat for about 15 minutes several times daily as needed for pain  Return if not improving

## 2013-05-26 ENCOUNTER — Other Ambulatory Visit: Payer: Self-pay | Admitting: Family Medicine

## 2014-10-08 ENCOUNTER — Ambulatory Visit (INDEPENDENT_AMBULATORY_CARE_PROVIDER_SITE_OTHER): Payer: PPO | Admitting: Family Medicine

## 2014-10-08 VITALS — BP 170/80 | HR 105 | Temp 98.5°F | Resp 18 | Ht 59.0 in | Wt 226.4 lb

## 2014-10-08 DIAGNOSIS — E119 Type 2 diabetes mellitus without complications: Secondary | ICD-10-CM

## 2014-10-08 DIAGNOSIS — E039 Hypothyroidism, unspecified: Secondary | ICD-10-CM

## 2014-10-08 DIAGNOSIS — D649 Anemia, unspecified: Secondary | ICD-10-CM | POA: Diagnosis not present

## 2014-10-08 DIAGNOSIS — G8929 Other chronic pain: Secondary | ICD-10-CM | POA: Diagnosis not present

## 2014-10-08 DIAGNOSIS — Z789 Other specified health status: Secondary | ICD-10-CM

## 2014-10-08 DIAGNOSIS — E785 Hyperlipidemia, unspecified: Secondary | ICD-10-CM | POA: Diagnosis not present

## 2014-10-08 DIAGNOSIS — I10 Essential (primary) hypertension: Secondary | ICD-10-CM | POA: Diagnosis not present

## 2014-10-08 DIAGNOSIS — M545 Low back pain, unspecified: Secondary | ICD-10-CM

## 2014-10-08 DIAGNOSIS — M25561 Pain in right knee: Secondary | ICD-10-CM | POA: Diagnosis not present

## 2014-10-08 LAB — COMPREHENSIVE METABOLIC PANEL
ALBUMIN: 3.7 g/dL (ref 3.6–5.1)
ALK PHOS: 80 U/L (ref 33–130)
ALT: 17 U/L (ref 6–29)
AST: 17 U/L (ref 10–35)
BUN: 17 mg/dL (ref 7–25)
CO2: 30 mmol/L (ref 20–31)
CREATININE: 0.78 mg/dL (ref 0.50–0.99)
Calcium: 9.3 mg/dL (ref 8.6–10.4)
Chloride: 100 mmol/L (ref 98–110)
Glucose, Bld: 340 mg/dL — ABNORMAL HIGH (ref 65–99)
POTASSIUM: 4.2 mmol/L (ref 3.5–5.3)
Sodium: 137 mmol/L (ref 135–146)
TOTAL PROTEIN: 6.5 g/dL (ref 6.1–8.1)
Total Bilirubin: 0.4 mg/dL (ref 0.2–1.2)

## 2014-10-08 LAB — POCT CBC
Granulocyte percent: 59 %G (ref 37–80)
HCT, POC: 42.6 % (ref 37.7–47.9)
Hemoglobin: 130 g/dL — AB (ref 12.2–16.2)
LYMPH, POC: 2.6 (ref 0.6–3.4)
MCH: 25.3 pg — AB (ref 27–31.2)
MCHC: 30.6 g/dL — AB (ref 31.8–35.4)
MCV: 82.9 fL (ref 80–97)
MID (CBC): 0.5 (ref 0–0.9)
MPV: 8.9 fL (ref 0–99.8)
POC Granulocyte: 4.5 (ref 2–6.9)
POC LYMPH PERCENT: 33.9 %L (ref 10–50)
POC MID %: 7.1 % (ref 0–12)
Platelet Count, POC: 193 10*3/uL (ref 142–424)
RBC: 5.14 M/uL (ref 4.04–5.48)
RDW, POC: 14.1 %
WBC: 7.7 10*3/uL (ref 4.6–10.2)

## 2014-10-08 LAB — POCT GLYCOSYLATED HEMOGLOBIN (HGB A1C): HEMOGLOBIN A1C: 11.7

## 2014-10-08 LAB — GLUCOSE, POCT (MANUAL RESULT ENTRY): POC Glucose: 373 mg/dl — AB (ref 70–99)

## 2014-10-08 MED ORDER — METFORMIN HCL 1000 MG PO TABS: 1000.0000 mg | ORAL_TABLET | Freq: Two times a day (BID) | ORAL | Status: DC

## 2014-10-08 MED ORDER — SIMVASTATIN 40 MG PO TABS: 40.0000 mg | ORAL_TABLET | Freq: Every day | ORAL | Status: DC

## 2014-10-08 MED ORDER — LISINOPRIL-HYDROCHLOROTHIAZIDE 20-12.5 MG PO TABS: 1.0000 | ORAL_TABLET | Freq: Every day | ORAL | Status: DC

## 2014-10-08 MED ORDER — LEVOTHYROXINE SODIUM 100 MCG PO TABS: ORAL_TABLET | ORAL | Status: DC

## 2014-10-08 MED ORDER — INSULIN ASPART PROT & ASPART (70-30 MIX) 100 UNIT/ML ~~LOC~~ SUSP: SUBCUTANEOUS | Status: DC

## 2014-10-08 NOTE — Patient Instructions (Addendum)
You should receive a call or letter about your lab results within the next week to 10 days.   Some of the medications were changed today to reflect the medicines you were taking before leaving the country.   Check your blood sugar 2-3 times per day. If any blood sugar is below 90, or lightheaded, dizzy, sweating -eat some form of sugar as this can be hypoglycemia or low blood sugar.   Metformin was previously at 1000 mg twice per day, so we'll write this dose today. Keep a record of your blood sugars outside the office at least twice per day, so we can discuss the medications further at next visit. Your diabetes is not under good control today.  Make sure to watch diet, avoids sweets like cakes and candies, and make sure you do not miss any doses of your medication.   For blood pressure, restarting lisinopril HCTZ at dose she was given last year. Check blood sugar outside of office and bring to next visit within 3-4 days as blood pressure was elevated here today. We may need to change medications at that time.   Recheck with Dr. Neva Seat next Tuesday morning 27th after 8 AM, or Wednesday 28th after 3 PM.   Once we are able to get a better idea of control of blood pressure and diabetes, we can discuss preoperative clearance for your knee.  Return to the clinic or go to the nearest emergency room if any of your symptoms worsen or new symptoms occur.

## 2014-10-08 NOTE — Progress Notes (Addendum)
Subjective:  This chart was scribed for [name], MD by Broadus John, Medical Scribe. This patient was seen in Room 14  and the patient's care was started at 3:53 PM.   Patient ID: Danielle Ferguson, female    DOB: 23-Feb-1950, 64 y.o.   MRN: [id number]  Chief Complaint  Patient presents with  . Medical Clearance    will have knee surgery    HPI HPI Comments: Danielle Ferguson is a 64 y.o. female with a history of DM and HTN , hypothyroidism, obesity, and hypolipidemia who presents with her son as an interpreter to [facility] to discuss medical clearance for a knee replacement surgery by Dr. Sharia Reeve landau. She had an evaluation on 09/08 with Dr. Dion Saucier, history pf back pain with total knee replacement in 03/2013. She had few falls then went to [place] for a year. Persistent dysfunction with the knee. 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 Plan on open repair for optimal long term function. She was referred to Dr.Ibazeba for her back pain. Noted form Dr. Dion Saucier also illustrated need for diabetes optimization prior to surgery. Today, pt states that she does not have an appointment scheduled for the surgery yet. Pt notes that due a recent fall, she was advised to have another right knee replacement.   Pt was last seen here at the office in April of last year where she was slightly anemic with a hemoglobin of 11.5. Pt was living in [place] since last visit, and is planning to stay here [place] the states for about 6 months. Today, pt denies symptoms of chest pain, difficulty breathing. Pt reports that she snores while sleeping.   HTN:  Pt was prescribed Lisinopril HCTZ 20- 12.5 mg BID last year, and is ok to have a refill of this medication. Pt has been taking indapamide and perindopril 5 mg in place of the Lisinopril while in [place]. Pt is also taking Aspirin 81 mg once a day. Pt indicates that she is not always  compliant with her medication due to being forgetful.   Back pain: Pt was having intermittent back pain at last visit, non traumatic, and was prescribed Flexeril last year which she found relief with. However, pt still present with these symptoms today and would like to get a refill for that. Pt has been using paracetanol and chlorzoxazone 250 mg as needed in [place].   Hypothyroidism: Pt was prescribed 100 mcg of synthroid, prescription form [place] seems to be the same dose. Lab Results  Component Value Date   TSH 3.986 04/28/2013   Diabetes: Pt reports that she take metformin-XR 750 mg BID. She indicates that she was not diagnosed with any complications secondary to this illness. Pt also takes Insulin novomix, 70:/0 insulin with a ratio of 30 rapid acting and 70 of long acting, 40 units at the morning time and 30 unit at night. Previously pt was treated with lantice 34 unit at day time and 24 units at night time. Pt denies having low blood sugars. She notes that she does routinely check her blood sugar, with cbg test being 200 today. She states that it was ranging in the 400's two days ago.     Hyperlipidemia:  Pt takes sandostatin 10 mg once a day.   There was a language barrier while communicating with the patient, son translating.     Patient Active Problem List   Diagnosis Date  Noted  . Knee osteoarthritis 04/08/2013  . HTN (hypertension) 01/10/2013  . Osteoarthritis of left knee 12/27/2012  . Osteoarthritis of right knee 12/27/2012  . Type II or unspecified type diabetes mellitus without mention of complication, uncontrolled 11/03/2012  . Unspecified hypothyroidism 11/03/2012   Past Medical History  Diagnosis Date  . Hypertension   . High cholesterol   . Thyroid disease   . Chronic knee pain   . Shortness of breath     with walking  . Headache(784.0)   . Constipation   . HTN (hypertension) 01/10/2013  . PONV (postoperative nausea and vomiting)   .  Hypothyroidism   . Type II diabetes mellitus   . Arthritis     "back; knees" (04/10/2013)  . Chronic radicular pain of lower back     "right side" (04/10/2013)   Past Surgical History  Procedure Laterality Date  . Total knee arthroplasty Left 01/07/2013    Procedure: TOTAL KNEE ARTHROPLASTY;  Surgeon: Eulas Post, MD;  Location: [facility] OR;  Service: Orthopedics;  Laterality: Left;  . Total knee arthroplasty Right 04/08/2013  . Appendectomy  ~ 2005  . Total knee arthroplasty Right 04/08/2013    Procedure: TOTAL KNEE ARTHROPLASTY;  Surgeon: Eulas Post, MD;  Location: [facility] OR;  Service: Orthopedics;  Laterality: Right;   No Known Allergies Prior to Admission medications   Medication Sig Start Date End Date Taking? Authorizing Provider  aspirin 81 MG tablet Take 81 mg by mouth daily.   Yes Historical Provider, MD  levothyroxine (SYNTHROID, LEVOTHROID) 100 MCG tablet Take 1 tab by mouth daily on Mon, Wed, Fri, and 1/2 tab other days. 04/28/13  Yes Peyton Najjar, MD  metFORMIN (GLUCOPHAGE) 1000 MG tablet Take 1 tablet (1,000 mg total) by mouth 2 (two) times daily with a meal. 04/28/13  Yes Peyton Najjar, MD  simvastatin (ZOCOR) 40 MG tablet Take 1 tablet (40 mg total) by mouth daily at 8 pm. 04/28/13  Yes Peyton Najjar, MD  cyclobenzaprine (FLEXERIL) 5 MG tablet Take one at bedtime as needed for muscle relaxant Patient not taking: Reported on 10/08/2014 05/10/13   Peyton Najjar, MD  docusate sodium (COLACE) 100 MG capsule Take 100 mg by mouth 2 (two) times daily.    Historical Provider, MD  enoxaparin (LOVENOX) 30 MG/0.3ML injection Inject 0.3 mLs (30 mg total) into the skin every 12 (twelve) hours. Patient not taking: Reported on 10/08/2014 04/08/13   Teryl Lucy, MD  glimepiride (AMARYL) 2 MG tablet Take 1 tablet (2 mg total) by mouth daily before breakfast. Patient not taking: Reported on 10/08/2014 04/28/13   Peyton Najjar, MD  Insulin Glargine (LANTUS SOLOSTAR) 100 UNIT/ML Solostar Pen Take  34 units in the morning and 24 units at bedtime. Patient not taking: Reported on 10/08/2014 04/28/13   Peyton Najjar, MD  lisinopril-hydrochlorothiazide (PRINZIDE,ZESTORETIC) 20-12.5 MG per tablet Take 1 tablet by mouth daily. PATIENT NEEDS OFFICE VISIT FOR ADDITIONAL REFILLS Patient not taking: Reported on 10/08/2014 04/28/13   Peyton Najjar, MD  meclizine (ANTIVERT) 12.5 MG tablet Take one pill every 6 hours as needed for dizziness. Patient not taking: Reported on 10/08/2014 04/28/13   Peyton Najjar, MD  methocarbamol (ROBAXIN) 500 MG tablet Take 1 tablet (500 mg total) by mouth 4 (four) times daily. Patient not taking: Reported on 10/08/2014 04/08/13   Teryl Lucy, MD  naproxen (NAPROSYN) 500 MG tablet Take one twice daily at breakfast and supper when needed for pain and inflammation  of back and sides Patient not taking: Reported on 10/08/2014 05/10/13   Peyton Najjar, MD  oxyCODONE-acetaminophen (PERCOCET) 10-325 MG per tablet Take 1-2 tablets by mouth every 6 (six) hours as needed for pain. MAXIMUM TOTAL ACETAMINOPHEN DOSE IS 4000 MG PER DAY Patient not taking: Reported on 10/08/2014 04/08/13   Teryl Lucy, MD  promethazine (PHENERGAN) 25 MG tablet Take 1 tablet (25 mg total) by mouth every 6 (six) hours as needed for nausea or vomiting. Patient not taking: Reported on 10/08/2014 04/08/13   Teryl Lucy, MD  sennosides-docusate sodium (SENOKOT-S) 8.6-50 MG tablet Take 2 tablets by mouth daily. Patient not taking: Reported on 10/08/2014 01/07/13   Teryl Lucy, MD   Social History   Social History  . Marital Status: Widowed    Spouse Name: N/A  . Number of Children: N/A  . Years of Education: N/A   Occupational History  . Not on file.   Social History Main Topics  . Smoking status: Never Smoker   . Smokeless tobacco: Never Used  . Alcohol Use: No  . Drug Use: No  . Sexual Activity: No   Other Topics Concern  . Not on file   Social History Narrative    Review of Systems    Respiratory: Negative for shortness of breath.   Cardiovascular: Negative for chest pain.  Musculoskeletal: Positive for myalgias, back pain and arthralgias.      Objective:   Physical Exam  Constitutional: She is oriented to person, place, and time. She appears well-developed and well-nourished. No distress.  Pt ambulates with a cane.  Obese.   HENT:  Head: Normocephalic and atraumatic.  Eyes: Conjunctivae and EOM are normal. Pupils are equal, round, and reactive to light.  Neck: Neck supple. Carotid bruit is not present.  Cardiovascular: Normal rate, regular rhythm, normal heart sounds and intact distal pulses.   Pulmonary/Chest: Effort normal and breath sounds normal. No respiratory distress. She has no wheezes. She has no rales.  Abdominal: Soft. She exhibits no pulsatile midline mass. There is no tenderness.  Musculoskeletal: She exhibits edema (Trace pedal edema bilaterally. ).  Neurological: She is alert and oriented to person, place, and time. No cranial nerve deficit.  Normal microfilament testing on feet.   Skin: Skin is warm and dry.  No calluses on feet noted.    Psychiatric: She has a normal mood and affect. Her behavior is normal.  Nursing note and vitals reviewed.  Filed Vitals:   10/08/14 1440  BP: 170/80  Pulse: 105  Temp: 98.5 F (36.9 C)  TempSrc: Oral  Resp: 18  Height:  (1.499 m)  Weight: 226 lb 6.4 oz (102.694 kg)  SpO2: 98%   Results for orders placed or performed in visit on 10/08/14  POCT glucose (manual entry)  Result Value Ref Range   POC Glucose 373 (A) 70 - 99 mg/dl  POCT glycosylated hemoglobin (Hb A1C)  Result Value Ref Range   Hemoglobin A1C 11.7   POCT CBC  Result Value Ref Range   WBC 7.7 4.6 - 10.2 K/uL   Lymph, poc 2.6 0.6 - 3.4   POC LYMPH PERCENT 33.9 10 - 50 %L   MID (cbc) 0.5 0 - 0.9   POC MID % 7.1 0 - 12 %M   POC Granulocyte 4.5 2 - 6.9   Granulocyte percent 59.0 37 - 80 %G   RBC 5.14 4.04 - 5.48 M/uL    Hemoglobin 130 (A) 12.2 - 16.2 g/dL   HCT,  POC 42.6 37.7 - 47.9 %   MCV 82.9 80 - 97 fL   MCH, POC 25.3 (A) 27 - 31.2 pg   MCHC 30.6 (A) 31.8 - 35.4 g/dL   RDW, POC 40.9 %   Platelet Count, POC 193 142 - 424 K/uL   MPV 8.9 0 - 99.8 fL    initial history and exam- 30 min, wrap up and discussion .       Assessment & Plan:   Miles Borkowski is a 64 y.o. female Type 2 diabetes mellitus without complication - Plan: POCT glucose (manual entry), POCT glycosylated hemoglobin (Hb A1C), metFORMIN (GLUCOPHAGE) 1000 MG tablet, DISCONTINUED: insulin aspart protamine- aspart (NOVOLOG MIX 70/30) (70-30) 100 UNIT/ML injection  -Continue on 70/30 insulin now as [place]. Previously had been treated with Lantus, but to minimize confusion in medications, remained on 70/30 for now.   -Increase metformin to 1000 mg twice a day, as uncontrolled on 750, and was on 1000 mg last year.  -Suspect she will need a change in her insulin regimen, but also reported occasional missed doses of medicines, and not sure if insulin is one of those. Advised to remain on same dose of insulin for now, hypoglycemic precautions given, but based on current A1c doubt she will experience low readings. RTC/ER precautions  Essential hypertension - Plan: Comprehensive metabolic panel, lisinopril-hydrochlorothiazide (PRINZIDE,ZESTORETIC) 20-12.5 MG per tablet  -Uncontrolled, but occasional med nonadherence.  Will restart lisinopril HCT as prescribed last year, check home blood pressure readings, and return in 5 days with blood pressure and blood sugar readings.   Hypothyroidism, unspecified hypothyroidism type - Plan: TSH, levothyroxine (SYNTHROID, LEVOTHROID) 100 MCG tablet  -TSH pending. Continue same dose of Synthroid for now.  Chronic low back pain  - Denies acute injury or recent changes in this pain. Per Ortho note, has been referred to another specialist to evaluate her back pain.  RTC/ER precautions if acute worsening.   Hyperlipidemia - Plan: simvastatin (ZOCOR) 40 MG tablet  -Simvastatin refilled at last years dose of 40 mg daily.   Right knee pain  -Appears to be a complicated situation with instability of previous patellar arthroplasty and torn medial retinaculum. Planning on inpatient surgery, but will need stability of blood pressure and diabetes prior to clearance.  Also will need further discussion to determine if further cardiac clearance needed for the surgery with her underlying diabetes and hypertension. Plan on preoperative clearance discussion at future visit,  as primary goal initially is improved diabetic and hypertensive control.  Anemia, unspecified anemia type - Plan: POCT CBC  -Repeat CBC. Can discuss further next visit.  Language barrier  -Son translating with understanding expressed.  Recheck in 5 days.   Meds ordered this encounter  Medications  . aspirin 81 MG tablet    Sig: Take 81 mg by mouth daily.  Marland Kitchen DISCONTD: metFORMIN (GLUCOPHAGE-XR) 750 MG 24 hr tablet    Sig: Take 750 mg by mouth 2 (two) times daily.  Marland Kitchen DISCONTD: insulin aspart protamine- aspart (NOVOLOG MIX 70/30) (70-30) 100 UNIT/ML injection    Sig: Inject into the skin. Takes 40 units with meal in morning, 30 units at night.  Marland Kitchen DISCONTD: insulin aspart protamine- aspart (NOVOLOG MIX 70/30) (70-30) 100 UNIT/ML injection    Sig: Takes 40 units with meal in morning, 30 units at night with meal    Dispense:  10 mL    Refill:  2  . levothyroxine (SYNTHROID, LEVOTHROID) 100 MCG  tablet    Sig: Take 1 tab by mouth daily on Mon, Wed, Fri, and 1/2 tab other days.    Dispense:  30 tablet    Refill:  2  . lisinopril-hydrochlorothiazide (PRINZIDE,ZESTORETIC) 20-12.5 MG per tablet    Sig: Take 1 tablet by mouth daily.    Dispense:  30 tablet    Refill:  2  . simvastatin (ZOCOR) 40 MG tablet    Sig: Take 1 tablet (40 mg total) by mouth daily at 8 pm.    Dispense:  30 tablet     Refill:  2  . metFORMIN (GLUCOPHAGE) 1000 MG tablet    Sig: Take 1 tablet (1,000 mg total) by mouth 2 (two) times daily with a meal.    Dispense:  60 tablet    Refill:  2   Patient Instructions  You should receive a call or letter about your lab results within the next week to 10 days.   Some of the medications were changed today to reflect the medicines you were taking before leaving the country.   Check your blood sugar 2-3 times per day. If any blood sugar is below 90, or lightheaded, dizzy, sweating -eat some form of sugar as this can be hypoglycemia or low blood sugar.   Metformin was previously at 1000 mg twice per day, so we'll write this dose today. Keep a record of your blood sugars outside the office at least twice per day, so we can discuss the medications further at next visit. Your diabetes is not under good control today.  Make sure to watch diet, avoids sweets like cakes and candies, and make sure you do not miss any doses of your medication.   For blood pressure, restarting lisinopril HCTZ at dose she was given last year. Check blood sugar outside of office and bring to next visit within 3-4 days as blood pressure was elevated here today. We may need to change medications at that time.   Recheck with Dr. Neva Seat next Tuesday morning 27th after 8 AM, or Wednesday 28th after 3 PM.   Once we are able to get a better idea of control of blood pressure and diabetes, we can discuss preoperative clearance for your knee.  Return to the clinic or go to the nearest emergency room if any of your symptoms worsen or new symptoms occur.       By signing my name below, I, Rawaa Al Rifaie, attest that this documentation has been prepared under the direction and in the presence of [name], MD.  Broadus John, Medical Scribe. 10/08/2014.  4:38 PM.   I personally performed the services described in this documentation, which was scribed in my presence. The recorded information has  been reviewed and considered, and addended by me as needed.

## 2014-10-09 ENCOUNTER — Telehealth: Payer: Self-pay

## 2014-10-09 DIAGNOSIS — E119 Type 2 diabetes mellitus without complications: Secondary | ICD-10-CM

## 2014-10-09 LAB — TSH: TSH: 1.817 u[IU]/mL (ref 0.350–4.500)

## 2014-10-09 MED ORDER — INSULIN ASPART PROT & ASPART (70-30 MIX) 100 UNIT/ML PEN: PEN_INJECTOR | SUBCUTANEOUS | Status: DC

## 2014-10-09 NOTE — Telephone Encounter (Signed)
Pharm called to change Novolog 70-30 to pens instead of vials. I re-sent Rx for pens.

## 2014-10-14 ENCOUNTER — Encounter: Payer: Self-pay | Admitting: Family Medicine

## 2014-10-31 ENCOUNTER — Ambulatory Visit (INDEPENDENT_AMBULATORY_CARE_PROVIDER_SITE_OTHER): Payer: PPO

## 2014-10-31 ENCOUNTER — Ambulatory Visit (INDEPENDENT_AMBULATORY_CARE_PROVIDER_SITE_OTHER): Payer: PPO | Admitting: Family Medicine

## 2014-10-31 VITALS — BP 164/90 | HR 78 | Temp 98.8°F | Ht 58.75 in | Wt 233.0 lb

## 2014-10-31 DIAGNOSIS — E039 Hypothyroidism, unspecified: Secondary | ICD-10-CM | POA: Diagnosis not present

## 2014-10-31 DIAGNOSIS — E1165 Type 2 diabetes mellitus with hyperglycemia: Secondary | ICD-10-CM | POA: Diagnosis not present

## 2014-10-31 DIAGNOSIS — M25561 Pain in right knee: Secondary | ICD-10-CM

## 2014-10-31 DIAGNOSIS — Z794 Long term (current) use of insulin: Secondary | ICD-10-CM | POA: Diagnosis not present

## 2014-10-31 DIAGNOSIS — I509 Heart failure, unspecified: Secondary | ICD-10-CM | POA: Diagnosis not present

## 2014-10-31 DIAGNOSIS — IMO0002 Reserved for concepts with insufficient information to code with codable children: Secondary | ICD-10-CM

## 2014-10-31 DIAGNOSIS — Z789 Other specified health status: Secondary | ICD-10-CM | POA: Diagnosis not present

## 2014-10-31 LAB — POCT CBC
Granulocyte percent: 65.7 %G (ref 37–80)
HCT, POC: 39.7 % (ref 37.7–47.9)
Hemoglobin: 13.4 g/dL (ref 12.2–16.2)
Lymph, poc: 3 (ref 0.6–3.4)
MCH, POC: 27.7 pg (ref 27–31.2)
MCHC: 33.8 g/dL (ref 31.8–35.4)
MCV: 81.8 fL (ref 80–97)
MID (cbc): 0.2 (ref 0–0.9)
MPV: 8.9 fL (ref 0–99.8)
POC Granulocyte: 6.1 (ref 2–6.9)
POC LYMPH PERCENT: 32.1 %L (ref 10–50)
POC MID %: 2.2 %M (ref 0–12)
Platelet Count, POC: 179 10*3/uL (ref 142–424)
RBC: 4.85 M/uL (ref 4.04–5.48)
RDW, POC: 14.2 %
WBC: 9.3 10*3/uL (ref 4.6–10.2)

## 2014-10-31 LAB — POCT GLYCOSYLATED HEMOGLOBIN (HGB A1C): Hemoglobin A1C: 9.7

## 2014-10-31 LAB — GLUCOSE, POCT (MANUAL RESULT ENTRY): POC Glucose: 291 mg/dl — AB (ref 70–99)

## 2014-10-31 LAB — HEMOGLOBIN A1C: HEMOGLOBIN A1C: 9.7 % — AB (ref 4.0–6.0)

## 2014-10-31 MED ORDER — DAPAGLIFLOZIN PROPANEDIOL 5 MG PO TABS: 5.0000 mg | ORAL_TABLET | Freq: Every day | ORAL | Status: DC

## 2014-10-31 MED ORDER — FLUCONAZOLE 150 MG PO TABS
150.0000 mg | ORAL_TABLET | Freq: Once | ORAL | Status: DC
Start: 2014-10-31 — End: 2014-12-29

## 2014-10-31 NOTE — Progress Notes (Addendum)
 @[facility] @  Patient ID: Danielle Ferguson MRN: [id number], DOB: Apr 26, 1950, 64 y.o. Date of Encounter: 10/31/2014, 3:05 PM  Primary Physician: Elvina SidleLAUENSTEIN,KURT, MD  Chief Complaint:  Chief Complaint  Patient presents with   Medical Clearance    for surgery on R/knee   Diabetes    check   Shortness of Breath    x 2 weeks   Headache    last pm   Facial Swelling    eye swelling x 4 days   Hypertension    has list of readings   Hypothyroidism    pt changed her meds to 1 tab once per day because eyes were swelling approx 4 days ago    HPI: 64 y.o. year old female with history below presents with need for surgical clearance.  Diabetes Pt was diagnosed with DM 10 years ago. She has been checking her sugar at home which has been ranging from 200-477. She's also had urinary frequency at night.   Right knee Pt is planning to have surgery on right knee, in 2 months. She had a knee replacement surgery 1 year ago. States after surgery she had a fall re injuring her knee.  SOB She's had SOB for 2 weeks that worsened today, especially when lying down. She become SOB with walking short distance, such as, to the car. She's also noticed swelling in her feet.  No chest pain or h/o heart attack  Patient does not speak [place].  Two sons interpret for her.  Facial swelling Pt noticed her eye had become puffy. Thinking symptoms were related to her thyroid, she has been taking more than prescribed amount of her thyroid medication.    Past Medical History  Diagnosis Date   Hypertension    High cholesterol    Thyroid disease    Chronic knee pain    Shortness of breath     with walking   Headache(784.0)    Constipation    HTN (hypertension) 01/10/2013   PONV (postoperative nausea and vomiting)    Hypothyroidism    Type II diabetes mellitus (HCC)    Arthritis     "back; knees" (04/10/2013)   Chronic radicular pain of lower back     "right side" (04/10/2013)      Home Meds: Prior to Admission medications   Medication Sig Start Date End Date Taking? Authorizing Provider  aspirin 81 MG tablet Take 81 mg by mouth daily.   Yes Historical Provider, MD  insulin aspart protamine - aspart (NOVOLOG 70/30 MIX) (70-30) 100 UNIT/ML FlexPen Takes 40 units with meal in morning, 30 units at night with meal 10/09/14  Yes Shade FloodJeffrey R Greene, MD  levothyroxine (SYNTHROID, LEVOTHROID) 100 MCG tablet Take 1 tab by mouth daily on Mon, Wed, Fri, and 1/2 tab other days. 10/08/14  Yes Shade FloodJeffrey R Greene, MD  lisinopril-hydrochlorothiazide (PRINZIDE,ZESTORETIC) 20-12.5 MG per tablet Take 1 tablet by mouth daily. 10/08/14  Yes Shade FloodJeffrey R Greene, MD  metFORMIN (GLUCOPHAGE) 1000 MG tablet Take 1 tablet (1,000 mg total) by mouth 2 (two) times daily with a meal. 10/08/14  Yes Shade FloodJeffrey R Greene, MD  naproxen (NAPROSYN) 500 MG tablet Take one twice daily at breakfast and supper when needed for pain and inflammation of back and sides 05/10/13  Yes Peyton Najjaravid H Hopper, MD  simvastatin (ZOCOR) 40 MG tablet Take 1 tablet (40 mg total) by mouth daily at 8 pm. 10/08/14  Yes Shade FloodJeffrey R Greene, MD    Allergies: No Known Allergies  Social History   Social  History   Marital Status: Widowed    Spouse Name: N/A   Number of Children: N/A   Years of Education: N/A   Occupational History   Not on file.   Social History Main Topics   Smoking status: Never Smoker    Smokeless tobacco: Never Used   Alcohol Use: No   Drug Use: No   Sexual Activity: No   Other Topics Concern   Not on file   Social History Narrative     Review of Systems: Constitutional: negative for chills, fever, night sweats, weight changes, or fatigue  HEENT: negative for vision changes, hearing loss, congestion, rhinorrhea, ST, epistaxis, or sinus pressure Cardiovascular: negative for chest pain or palpitations Respiratory: negative for hemoptysis, wheezing, or cough Abdominal: negative for abdominal pain, nausea,  vomiting, diarrhea, or constipation Dermatological: negative for rash Neurologic: negative for headache, dizziness, or syncope All other systems reviewed and are otherwise negative with the exception to those above and in the HPI.   Physical Exam:  Morbidly obese Blood pressure 164/90, pulse 78, temperature 98.8 F (37.1 C), temperature source Oral, height 4' 10.75" (1.492 m), weight 233 lb (105.688 kg), SpO2 98 %., Body mass index is 47.48 kg/(m^2). General: Well developed, well nourished, in no acute distress. Head: Normocephalic, atraumatic, eyes without discharge, sclera non-icteric, nares are without discharge. Bilateral auditory canals clear, TM's are without perforation, pearly grey and translucent with reflective cone of light bilaterally. Oral cavity moist, posterior pharynx without exudate, erythema, peritonsillar abscess, or post nasal drip.  Neck: Supple. No thyromegaly. Full ROM. No lymphadenopathy. Lungs: Clear bilaterally to auscultation bibasilar rales but no wheezes or rhonchi. Breathing is unlabored. Heart: regular rate.  S4 gallop rhythm, .  No murmurs or rubs,  appreciated. Abdomen: Soft, non-tender, non-distended with normoactive bowel sounds. No hepatomegaly. No rebound/guarding. No obvious abdominal masses. Msk:  Strength and tone normal for age. Extremities/Skin: Warm and dry. No clubbing or cyanosis. No edema. No rashes or suspicious lesions. Neuro: Alert and oriented X 3. Moves all extremities spontaneously. Gait is normal. CNII-XII grossly in tact. Psych:  Responds to questions appropriately with a normal affect.   Labs: Results for orders placed or performed in visit on 10/31/14  POCT CBC  Result Value Ref Range   WBC 9.3 4.6 - 10.2 K/uL   Lymph, poc 3.0 0.6 - 3.4   POC LYMPH PERCENT 32.1 10 - 50 %L   MID (cbc) 0.2 0 - 0.9   POC MID % 2.2 0 - 12 %M   POC Granulocyte 6.1 2 - 6.9   Granulocyte percent 65.7 37 - 80 %G   RBC 4.85 4.04 - 5.48 M/uL   Hemoglobin  13.4 12.2 - 16.2 g/dL   HCT, POC 96.0 45.4 - 47.9 %   MCV 81.8 80 - 97 fL   MCH, POC 27.7 27 - 31.2 pg   MCHC 33.8 31.8 - 35.4 g/dL   RDW, POC 09.8 %   Platelet Count, POC 179 142 - 424 K/uL   MPV 8.9 0 - 99.8 fL  POCT glycosylated hemoglobin (Hb A1C)  Result Value Ref Range   Hemoglobin A1C 9.7   POCT glucose (manual entry)  Result Value Ref Range   POC Glucose 291 (A) 70 - 99 mg/dl   [facility] reading (PRIMARY) by  Dr. Lurlean Leyden x-ray: No signs of heart failure, patient is obese and reading the lateral as needed next to impossible EKG:  NSR  ASSESSMENT AND PLAN:  64 y.o. year  old female with uncontrolled diabetes, congestive heart failure, and right knee pain requiring surgical intervention. She needs surgical clearance but this point the diabetes is not well-controlled. I think if we diurese her somewhat, have her see the cardiologist, and then recheck her in a few days to make sure that her fluid status is normalized, we can get her to surgery soon.  This chart was scribed in my presence and reviewed by me personally.    ICD-9-CM ICD-10-CM   1. Insulin dependent type 2 diabetes mellitus, uncontrolled (HCC) 250.02 E11.65 POCT CBC   V58.67 Z79.4 POCT glycosylated hemoglobin (Hb A1C)     POCT glucose (manual entry)     TSH     dapagliflozin propanediol (FARXIGA) 5 MG TABS tablet     fluconazole (DIFLUCAN) 150 MG tablet  2. Chronic congestive heart failure, unspecified congestive heart failure type (HCC) 428.0 I50.9 EKG 12-Lead     Ambulatory referral to Cardiology     DG Chest 2 View  3. Hypothyroidism, unspecified hypothyroidism type 244.9 E03.9   4. Right knee pain 719.46 M25.561   5. Language barrier V49.89 Z78.9     By signing my name below, I, Raven Small, attest that this documentation has been prepared under the direction and in the presence of Elvina Sidle, MD.  Electronically Signed: Andrew Au, ED Scribe. 10/31/2014. 3:19 PM.  Signed, Elvina Sidle,  MD 10/31/2014 3:05 PM

## 2014-10-31 NOTE — Patient Instructions (Addendum)
Resume your usual thyroid dose.  I am setting up a cardiology appointment to better evaluate the heart  I'm putting you on a new diabetes medicine to be taken with the other medicines. This will make you people more but it will help get rid of a lot of sugar that extra and also help get rid of extra water in your system.  Please return in 10 days for final visit

## 2014-11-01 LAB — TSH: TSH: 4.123 u[IU]/mL (ref 0.350–4.500)

## 2014-11-02 NOTE — Progress Notes (Signed)
Printed lab results also and placed in outgoing mail box

## 2014-11-03 MED ORDER — LEVOTHYROXINE SODIUM 100 MCG PO TABS: ORAL_TABLET | ORAL | Status: DC

## 2014-11-03 NOTE — Addendum Note (Signed)
Addended by: Johnnette LitterARDWELL, Detron Carras M on: 11/03/2014 08:02 AM   Modules accepted: Orders, SmartSet

## 2014-11-13 ENCOUNTER — Encounter: Payer: Self-pay | Admitting: Family Medicine

## 2014-11-19 ENCOUNTER — Ambulatory Visit (INDEPENDENT_AMBULATORY_CARE_PROVIDER_SITE_OTHER): Payer: PPO

## 2014-11-19 ENCOUNTER — Ambulatory Visit (INDEPENDENT_AMBULATORY_CARE_PROVIDER_SITE_OTHER): Payer: PPO | Admitting: Family Medicine

## 2014-11-19 VITALS — BP 156/83 | HR 103 | Temp 98.1°F | Resp 16 | Ht <= 58 in | Wt 231.0 lb

## 2014-11-19 DIAGNOSIS — R059 Cough, unspecified: Secondary | ICD-10-CM

## 2014-11-19 DIAGNOSIS — R05 Cough: Secondary | ICD-10-CM

## 2014-11-19 DIAGNOSIS — E119 Type 2 diabetes mellitus without complications: Secondary | ICD-10-CM | POA: Diagnosis not present

## 2014-11-19 LAB — POCT CBC
Granulocyte percent: 58 %G (ref 37–80)
HCT, POC: 42.4 % (ref 37.7–47.9)
Hemoglobin: 14.3 g/dL (ref 12.2–16.2)
Lymph, poc: 3.6 — AB (ref 0.6–3.4)
MCH, POC: 27.6 pg (ref 27–31.2)
MCHC: 33.7 g/dL (ref 31.8–35.4)
MCV: 81.9 fL (ref 80–97)
MID (cbc): 0.3 (ref 0–0.9)
MPV: 9.6 fL (ref 0–99.8)
POC Granulocyte: 5.4 (ref 2–6.9)
POC LYMPH PERCENT: 38.5 %L (ref 10–50)
POC MID %: 3.5 %M (ref 0–12)
Platelet Count, POC: 237 10*3/uL (ref 142–424)
RBC: 5.18 M/uL (ref 4.04–5.48)
RDW, POC: 14.1 %
WBC: 9.3 10*3/uL (ref 4.6–10.2)

## 2014-11-19 LAB — POCT URINALYSIS DIP (MANUAL ENTRY)
Bilirubin, UA: NEGATIVE
Blood, UA: NEGATIVE
Glucose, UA: >=1000 — AB
Ketones, POC UA: NEGATIVE
Leukocytes, UA: NEGATIVE
Nitrite, UA: NEGATIVE
Protein Ur, POC: 30 — AB
Spec Grav, UA: 1.015
Urobilinogen, UA: 0.2
pH, UA: 5

## 2014-11-19 LAB — GLUCOSE, POCT (MANUAL RESULT ENTRY): POC Glucose: 270 mg/dl — AB (ref 70–99)

## 2014-11-19 MED ORDER — MONTELUKAST SODIUM 10 MG PO TABS: 10.0000 mg | ORAL_TABLET | Freq: Every day | ORAL | Status: DC

## 2014-11-19 NOTE — Progress Notes (Addendum)
Subjective:  This chart was scribed for Elvina Sidle, MD by Broadus John, Medical Scribe. This patient was seen in Room 1 and the patient's care was started at 7:01 PM.   Patient ID: Danielle Ferguson, female    DOB: 1950-08-20, 64 y.o.   MRN: [id number]  Chief Complaint  Patient presents with   Medical Clearance    pt. would like to get clearance so she can have knee surgery(right).    HPI HPI Comments: Danielle Ferguson is a 64 y.o. female who presents to [facility] with her son as an interpreter requesting medical clearance for a right knee surgery.  Pt indicates that after her last visit here on 10/15 with me, she presented with headaches, and she attributes to her high blood pressure medication. Pt also reports symptoms of cough, occasionally at night time. Pt denies lower extremities edema.    Patient Active Problem List   Diagnosis Date Noted   Knee osteoarthritis 04/08/2013   HTN (hypertension) 01/10/2013   Osteoarthritis of left knee 12/27/2012   Osteoarthritis of right knee 12/27/2012   Type II or unspecified type diabetes mellitus without mention of complication, uncontrolled 11/03/2012   Unspecified hypothyroidism 11/03/2012   Past Medical History  Diagnosis Date   Hypertension    High cholesterol    Thyroid disease    Chronic knee pain    Shortness of breath     with walking   Headache(784.0)    Constipation    HTN (hypertension) 01/10/2013   PONV (postoperative nausea and vomiting)    Hypothyroidism    Type II diabetes mellitus (HCC)    Arthritis     "back; knees" (04/10/2013)   Chronic radicular pain of lower back     "right side" (04/10/2013)   Past Surgical History  Procedure Laterality Date   Total knee arthroplasty Left 01/07/2013    Procedure: TOTAL KNEE ARTHROPLASTY;  Surgeon: Eulas Post, MD;  Location: [facility] OR;  Service: Orthopedics;  Laterality: Left;   Total knee arthroplasty Right 04/08/2013     Appendectomy  ~ 2005   Total knee arthroplasty Right 04/08/2013    Procedure: TOTAL KNEE ARTHROPLASTY;  Surgeon: Eulas Post, MD;  Location: [facility] OR;  Service: Orthopedics;  Laterality: Right;   No Known Allergies Prior to Admission medications   Medication Sig Start Date End Date Taking? Authorizing Provider  aspirin 81 MG tablet Take 81 mg by mouth daily.    Historical Provider, MD  dapagliflozin propanediol (FARXIGA) 5 MG TABS tablet Take 5 mg by mouth daily. 10/31/14   Elvina Sidle, MD  fluconazole (DIFLUCAN) 150 MG tablet Take 1 tablet (150 mg total) by mouth once. 10/31/14   Elvina Sidle, MD  insulin aspart protamine - aspart (NOVOLOG 70/30 MIX) (70-30) 100 UNIT/ML FlexPen Takes 40 units with meal in morning, 30 units at night with meal 10/09/14   Shade Flood, MD  levothyroxine (SYNTHROID, LEVOTHROID) 100 MCG tablet Take 1 tab by mouth daily on Mon, Wed, Fri, and 1/2 tab other days. 11/03/14   Elvina Sidle, MD  lisinopril-hydrochlorothiazide (PRINZIDE,ZESTORETIC) 20-12.5 MG per tablet Take 1 tablet by mouth daily. 10/08/14   Shade Flood, MD  metFORMIN (GLUCOPHAGE) 1000 MG tablet Take 1 tablet (1,000 mg total) by mouth 2 (two) times daily with a meal. 10/08/14   Shade Flood, MD  naproxen (NAPROSYN) 500 MG tablet Take one twice daily at breakfast and supper when needed for pain and inflammation of back and  sides 05/10/13   Peyton Najjaravid H Hopper, MD  simvastatin (ZOCOR) 40 MG tablet Take 1 tablet (40 mg total) by mouth daily at 8 pm. 10/08/14   Shade FloodJeffrey R Greene, MD   Social History   Social History   Marital Status: Widowed    Spouse Name: N/A   Number of Children: N/A   Years of Education: N/A   Occupational History   Not on file.   Social History Main Topics   Smoking status: Never Smoker    Smokeless tobacco: Never Used   Alcohol Use: No   Drug Use: No   Sexual Activity: No   Other Topics Concern   Not on file   Social History Narrative     Review of Systems  Respiratory: Positive for cough.   Cardiovascular: Negative for leg swelling.  Genitourinary: Negative for hematuria.  Neurological: Positive for headaches.       Objective:   Physical Exam  Constitutional: She is oriented to person, place, and time. She appears well-developed and well-nourished. No distress.  HENT:  Head: Normocephalic and atraumatic.  Right Ear: External ear normal.  Left Ear: External ear normal.  Mouth/Throat: Oropharynx is clear and moist.  Eyes: Conjunctivae and EOM are normal. Pupils are equal, round, and reactive to light.  Neck: Normal range of motion. Neck supple. No JVD present. No tracheal deviation present. No thyromegaly present.  Cardiovascular: Normal rate, regular rhythm, normal heart sounds and intact distal pulses.   Pulmonary/Chest: Effort normal. She has wheezes (expiratory ).  Musculoskeletal:  Thickened synovial tissues both knees  Lymphadenopathy:    She has no cervical adenopathy.  Neurological: She is alert and oriented to person, place, and time. No cranial nerve deficit.  Skin: Skin is warm and dry.  Psychiatric: She has a normal mood and affect. Her behavior is normal.  Nursing note and vitals reviewed.  [facility] reading (PRIMARY) by  Dr. Milus GlazierLauenstein  CXR.  Normal cardiac silhouette, no infiltrates Results for orders placed or performed in visit on 11/13/14  Hemoglobin A1c  Result Value Ref Range   Hgb A1c MFr Bld 9.7 (A) 4.0 - 6.0 %     BP 156/83 mmHg   Pulse 103   Temp(Src) 98.1 F (36.7 C) (Oral)   Resp 16   Ht 4\' 10"  (1.473 m)   Wt 231 lb (104.781 kg)   BMI 48.29 kg/m2   SpO2 97% Results for orders placed or performed in visit on 11/19/14  POCT glucose (manual entry)  Result Value Ref Range   POC Glucose 270 (A) 70 - 99 mg/dl  POCT CBC  Result Value Ref Range   WBC 9.3 4.6 - 10.2 K/uL   Lymph, poc 3.6 (A) 0.6 - 3.4   POC LYMPH PERCENT 38.5 10 - 50 %L   MID (cbc) 0.3 0 - 0.9   POC MID % 3.5 0 - 12 %M    POC Granulocyte 5.4 2 - 6.9   Granulocyte percent 58.0 37 - 80 %G   RBC 5.18 4.04 - 5.48 M/uL   Hemoglobin 14.3 12.2 - 16.2 g/dL   HCT, POC 09.842.4 11.937.7 - 47.9 %   MCV 81.9 80 - 97 fL   MCH, POC 27.6 27 - 31.2 pg   MCHC 33.7 31.8 - 35.4 g/dL   RDW, POC 14.714.1 %   Platelet Count, POC 237 142 - 424 K/uL   MPV 9.6 0 - 99.8 fL  POCT urinalysis dipstick  Result Value Ref Range   Color,  UA yellow yellow   Clarity, UA clear clear   Glucose, UA >=1,000 (A) negative   Bilirubin, UA negative negative   Ketones, POC UA negative negative   Spec Grav, UA 1.015    Blood, UA negative negative   pH, UA 5.0    Protein Ur, POC =30 (A) negative   Urobilinogen, UA 0.2    Nitrite, UA Negative Negative   Leukocytes, UA Negative Negative       Assessment & Plan:  The diabetes seems under better control although today's sugar is high.  She does have a mild cough with some expiratory wheezes, but no shortness of breath and this seems stable as well.  I believe she needs one more follow up to make sure she is stable enough for surgery.     By signing my name below, I, Rawaa Al Rifaie, attest that this documentation has been prepared under the direction and in the presence of Elvina Sidle, MD.  Watt Climes Rifaie, Medical Scribe. 11/19/2014.  7:04 PM.   This chart was scribed in my presence and reviewed by me personally.    ICD-9-CM ICD-10-CM   1. Cough 786.2 R05 POCT CBC     DG Chest 2 View     montelukast (SINGULAIR) 10 MG tablet  2. Type 2 diabetes mellitus without complication, without long-term current use of insulin (HCC) 250.00 E11.9 POCT glucose (manual entry)     COMPLETE METABOLIC PANEL WITH GFR     POCT urinalysis dipstick     Signed, Elvina Sidle, MD

## 2014-11-19 NOTE — Patient Instructions (Signed)
The blood sugar continues to be elevated. Please take extra care in selecting foods that do not have large amounts of sugar or carbohydrate.  I have prescribed a medicine for the cough which should help stop the wheezing and chest congestion.  Please return in 1-2 weeks for one final check before trying to have the surgery.

## 2014-11-20 LAB — COMPLETE METABOLIC PANEL WITH GFR
ALT: 21 U/L (ref 6–29)
AST: 23 U/L (ref 10–35)
Albumin: 4.1 g/dL (ref 3.6–5.1)
Alkaline Phosphatase: 67 U/L (ref 33–130)
BUN: 21 mg/dL (ref 7–25)
CO2: 29 mmol/L (ref 20–31)
Calcium: 9.5 mg/dL (ref 8.6–10.4)
Chloride: 105 mmol/L (ref 98–110)
Creat: 0.94 mg/dL (ref 0.50–0.99)
GFR, Est African American: 75 mL/min (ref >=60)
GFR, Est Non African American: 65 mL/min (ref >=60)
Glucose, Bld: 271 mg/dL — ABNORMAL HIGH (ref 65–99)
Potassium: 4.5 mmol/L (ref 3.5–5.3)
Sodium: 144 mmol/L (ref 135–146)
Total Bilirubin: 0.3 mg/dL (ref 0.2–1.2)
Total Protein: 7.1 g/dL (ref 6.1–8.1)

## 2014-12-07 ENCOUNTER — Encounter: Payer: Self-pay | Admitting: Family Medicine

## 2014-12-07 ENCOUNTER — Ambulatory Visit (INDEPENDENT_AMBULATORY_CARE_PROVIDER_SITE_OTHER): Payer: PPO | Admitting: Family Medicine

## 2014-12-07 VITALS — BP 158/72 | HR 96 | Temp 98.7°F | Resp 16 | Ht 60.0 in | Wt 233.0 lb

## 2014-12-07 DIAGNOSIS — Z23 Encounter for immunization: Secondary | ICD-10-CM | POA: Diagnosis not present

## 2014-12-07 DIAGNOSIS — E119 Type 2 diabetes mellitus without complications: Secondary | ICD-10-CM

## 2014-12-07 LAB — GLUCOSE, POCT (MANUAL RESULT ENTRY): POC Glucose: 144 mg/dl — AB (ref 70–99)

## 2014-12-07 LAB — HEMOGLOBIN A1C: HEMOGLOBIN A1C: 8.3 % — AB (ref 4.0–6.0)

## 2014-12-07 LAB — POCT GLYCOSYLATED HEMOGLOBIN (HGB A1C): Hemoglobin A1C: 8.3

## 2014-12-07 NOTE — Progress Notes (Signed)
This chart was scribed for Elvina Sidle, MD by Stann Ore, medical scribe at [facility].The patient was seen in exam room 11 and the patient's care was started at 12:27 PM.  Patient ID: Danielle Ferguson MRN: [id number], DOB: [birth year], 64 y.o. Date of Encounter: 12/07/2014  Primary Physician: Elvina Sidle, MD  Chief Complaint:  Chief Complaint  Patient presents with  . surgery clearance    right knee replacement  . Flu Vaccine    HPI:  Danielle Ferguson is a 64 y.o. female who presents to [facility] for surgery clearance for right knee surgery. She needs her sugars checked for surgery. She's been eating dates and well.   Cough She was last in the office for cough 18 days ago. She states that she's feeling better.   Flu Shot She also requested for a flu vaccine.   Personal Her primary language is Arabic and her son translated for her in the room.   Past Medical History  Diagnosis Date  . Hypertension   . High cholesterol   . Thyroid disease   . Chronic knee pain   . Shortness of breath     with walking  . Headache(784.0)   . Constipation   . HTN (hypertension) 01/10/2013  . PONV (postoperative nausea and vomiting)   . Hypothyroidism   . Type II diabetes mellitus (HCC)   . Arthritis     "back; knees" (04/10/2013)  . Chronic radicular pain of lower back     "right side" (04/10/2013)     Home Meds: Prior to Admission medications   Medication Sig Start Date End Date Taking? Authorizing Provider  aspirin 81 MG tablet Take 81 mg by mouth daily.   Yes Historical Provider, MD  dapagliflozin propanediol (FARXIGA) 5 MG TABS tablet Take 5 mg by mouth daily. 10/31/14  Yes Elvina Sidle, MD  fluconazole (DIFLUCAN) 150 MG tablet Take 1 tablet (150 mg total) by mouth once. 10/31/14  Yes Elvina Sidle, MD  insulin aspart protamine - aspart (NOVOLOG 70/30 MIX) (70-30) 100 UNIT/ML FlexPen Takes 40 units with meal in morning,  30 units at night with meal 10/09/14  Yes Shade Flood, MD  levothyroxine (SYNTHROID, LEVOTHROID) 100 MCG tablet Take 1 tab by mouth daily on Mon, Wed, Fri, and 1/2 tab other days. 11/03/14  Yes Elvina Sidle, MD  lisinopril-hydrochlorothiazide (PRINZIDE,ZESTORETIC) 20-12.5 MG per tablet Take 1 tablet by mouth daily. 10/08/14  Yes Shade Flood, MD  metFORMIN (GLUCOPHAGE) 1000 MG tablet Take 1 tablet (1,000 mg total) by mouth 2 (two) times daily with a meal. 10/08/14  Yes Shade Flood, MD  montelukast (SINGULAIR) 10 MG tablet Take 1 tablet (10 mg total) by mouth at bedtime. 11/19/14  Yes Elvina Sidle, MD  naproxen (NAPROSYN) 500 MG tablet Take one twice daily at breakfast and supper when needed for pain and inflammation of back and sides 05/10/13  Yes Peyton Najjar, MD  simvastatin (ZOCOR) 40 MG tablet Take 1 tablet (40 mg total) by mouth daily at 8 pm. 10/08/14  Yes Shade Flood, MD    Allergies: No Known Allergies  Social History   Social History  . Marital Status: Widowed    Spouse Name: N/A  . Number of Children: N/A  . Years of Education: N/A   Occupational History  . Not on file.   Social History Main Topics  . Smoking status: Never Smoker   . Smokeless tobacco: Never Used  . Alcohol  Use: No  . Drug Use: No  . Sexual Activity: No   Other Topics Concern  . Not on file   Social History Narrative     Review of Systems: Constitutional: negative for fever, chills, night sweats, weight changes, or fatigue  HEENT: negative for vision changes, hearing loss, congestion, rhinorrhea, ST, epistaxis, or sinus pressure Cardiovascular: negative for chest pain or palpitations Respiratory: negative for hemoptysis, wheezing, shortness of breath, or cough Abdominal: negative for abdominal pain, nausea, vomiting, diarrhea, or constipation Dermatological: negative for rash Neurologic: negative for headache, dizziness, or syncope Musc: positive for arthralgia right knee All  other systems reviewed and are otherwise negative with the exception to those above and in the HPI.  Physical Exam: Blood pressure 158/72, pulse 96, temperature 98.7 F (37.1 C), temperature source Oral, resp. rate 16, height 5' (1.524 m), weight 233 lb (105.688 kg), SpO2 98 %., Body mass index is 45.5 kg/(m^2). General: Well developed, well nourished, in no acute distress. Head: Normocephalic, atraumatic, eyes without discharge, sclera non-icteric, nares are without discharge. Bilateral auditory canals clear, TM's are without perforation, pearly grey and translucent with reflective cone of light bilaterally. Oral cavity moist, posterior pharynx without exudate, erythema, peritonsillar abscess, or post nasal drip.  Neck: Supple. No thyromegaly. Full ROM. No lymphadenopathy. Lungs: Clear bilaterally to auscultation without wheezes, rales, or rhonchi. Breathing is unlabored. Heart: RRR with S1 S2. No murmurs, rubs, or gallops appreciated. Msk:  Strength and tone normal for age. Extremities/Skin: Warm and dry. No clubbing or cyanosis. No edema. No rashes or suspicious lesions. Neuro: Alert and oriented X 3. Moves all extremities spontaneously. Gait is normal. CNII-XII grossly in tact. Psych:  Responds to questions appropriately with a normal affect.   Labs: Results for orders placed or performed in visit on 12/07/14  POCT glucose (manual entry)  Result Value Ref Range   POC Glucose 144 (A) 70 - 99 mg/dl     ASSESSMENT AND PLAN:  64 y.o. year old female with  This chart was scribed in my presence and reviewed by me personally.    ICD-9-CM ICD-10-CM   1. Controlled type 2 diabetes mellitus without complication, without long-term current use of insulin (HCC) 250.00 E11.9 Flu Vaccine QUAD 36+ mos IM     POCT glucose (manual entry)     POCT glycosylated hemoglobin (Hb A1C)      By signing my name below, I, Stann Oresung-Kai Tsai, attest that this documentation has been prepared under the direction  and in the presence of Elvina SidleKurt Kayvion Arneson, MD. Electronically Signed: Stann Oresung-Kai Tsai, Scribe. 12/07/2014 , 12:27 PM .  Signed, Elvina SidleKurt Harlem Bula, MD 12/07/2014 12:27 PM

## 2014-12-07 NOTE — Patient Instructions (Addendum)
I have cleared you for knee surgery    Influenza Virus Vaccine injection (Fluarix) What is this medicine? INFLUENZA VIRUS VACCINE (in floo EN zuh VAHY ruhs vak SEEN) helps to reduce the risk of getting influenza also known as the flu. This medicine may be used for other purposes; ask your health care provider or pharmacist if you have questions. What should I tell my health care provider before I take this medicine? They need to know if you have any of these conditions: -bleeding disorder like hemophilia -fever or infection -Guillain-Barre syndrome or other neurological problems -immune system problems -infection with the human immunodeficiency virus (HIV) or AIDS -low blood platelet counts -multiple sclerosis -an unusual or allergic reaction to influenza virus vaccine, eggs, chicken proteins, latex, gentamicin, other medicines, foods, dyes or preservatives -pregnant or trying to get pregnant -breast-feeding How should I use this medicine? This vaccine is for injection into a muscle. It is given by a health care professional. A copy of Vaccine Information Statements will be given before each vaccination. Read this sheet carefully each time. The sheet may change frequently. Talk to your pediatrician regarding the use of this medicine in children. Special care may be needed. Overdosage: If you think you have taken too much of this medicine contact a poison control center or emergency room at once. NOTE: This medicine is only for you. Do not share this medicine with others. What if I miss a dose? This does not apply. What may interact with this medicine? -chemotherapy or radiation therapy -medicines that lower your immune system like etanercept, anakinra, infliximab, and adalimumab -medicines that treat or prevent blood clots like warfarin -phenytoin -steroid medicines like prednisone or cortisone -theophylline -vaccines This list may not describe all possible interactions. Give your  health care provider a list of all the medicines, herbs, non-prescription drugs, or dietary supplements you use. Also tell them if you smoke, drink alcohol, or use illegal drugs. Some items may interact with your medicine. What should I watch for while using this medicine? Report any side effects that do not go away within 3 days to your doctor or health care professional. Call your health care provider if any unusual symptoms occur within 6 weeks of receiving this vaccine. You may still catch the flu, but the illness is not usually as bad. You cannot get the flu from the vaccine. The vaccine will not protect against colds or other illnesses that may cause fever. The vaccine is needed every year. What side effects may I notice from receiving this medicine? Side effects that you should report to your doctor or health care professional as soon as possible: -allergic reactions like skin rash, itching or hives, swelling of the face, lips, or tongue Side effects that usually do not require medical attention (report to your doctor or health care professional if they continue or are bothersome): -fever -headache -muscle aches and pains -pain, tenderness, redness, or swelling at site where injected -weak or tired This list may not describe all possible side effects. Call your doctor for medical advice about side effects. You may report side effects to FDA at 1-800-FDA-1088. Where should I keep my medicine? This vaccine is only given in a clinic, pharmacy, doctor's office, or other health care setting and will not be stored at home. NOTE: This sheet is a summary. It may not cover all possible information. If you have questions about this medicine, talk to your doctor, pharmacist, or health care provider.    2016, Elsevier/Gold Standard. (  2007-07-31 09:30:40)  

## 2014-12-08 ENCOUNTER — Encounter: Payer: Self-pay | Admitting: Physician Assistant

## 2014-12-16 ENCOUNTER — Encounter: Payer: Self-pay | Admitting: Family Medicine

## 2014-12-25 ENCOUNTER — Other Ambulatory Visit: Payer: Self-pay | Admitting: Orthopedic Surgery

## 2014-12-28 ENCOUNTER — Encounter (HOSPITAL_COMMUNITY): Payer: Self-pay | Admitting: *Deleted

## 2014-12-28 MED ORDER — CEFAZOLIN SODIUM-DEXTROSE 2-3 GM-% IV SOLR
2.0000 g | INTRAVENOUS | Status: AC
  Administered 2014-12-29: 2 g via INTRAVENOUS

## 2014-12-28 MED ORDER — VANCOMYCIN HCL 10 G IV SOLR
1500.0000 mg | INTRAVENOUS | Status: DC
  Filled 2014-12-28: qty 1500

## 2014-12-29 ENCOUNTER — Inpatient Hospital Stay (HOSPITAL_COMMUNITY)
Admission: AD | Admit: 2014-12-29 | Discharge: 2015-01-01 | DRG: 488 | Disposition: A | Discharge status: Home Health | Payer: PPO | Source: Ambulatory Visit | Attending: Orthopedic Surgery | Admitting: Orthopedic Surgery

## 2014-12-29 ENCOUNTER — Other Ambulatory Visit: Payer: Self-pay | Admitting: Family Medicine

## 2014-12-29 ENCOUNTER — Ambulatory Visit (HOSPITAL_COMMUNITY): Payer: PPO | Admitting: Anesthesiology

## 2014-12-29 ENCOUNTER — Encounter (HOSPITAL_COMMUNITY)
Admission: AD | Disposition: A | Discharge status: Home Health | Payer: Self-pay | Source: Ambulatory Visit | Attending: Orthopedic Surgery

## 2014-12-29 ENCOUNTER — Encounter (HOSPITAL_COMMUNITY): Payer: Self-pay | Admitting: *Deleted

## 2014-12-29 DIAGNOSIS — M2201 Recurrent dislocation of patella, right knee: Secondary | ICD-10-CM | POA: Diagnosis present

## 2014-12-29 DIAGNOSIS — E119 Type 2 diabetes mellitus without complications: Secondary | ICD-10-CM | POA: Diagnosis present

## 2014-12-29 DIAGNOSIS — E669 Obesity, unspecified: Secondary | ICD-10-CM | POA: Diagnosis present

## 2014-12-29 DIAGNOSIS — Z79899 Other long term (current) drug therapy: Secondary | ICD-10-CM | POA: Diagnosis not present

## 2014-12-29 DIAGNOSIS — M25569 Pain in unspecified knee: Secondary | ICD-10-CM | POA: Diagnosis present

## 2014-12-29 DIAGNOSIS — G8929 Other chronic pain: Secondary | ICD-10-CM | POA: Diagnosis present

## 2014-12-29 DIAGNOSIS — Z96652 Presence of left artificial knee joint: Secondary | ICD-10-CM | POA: Diagnosis present

## 2014-12-29 DIAGNOSIS — Z794 Long term (current) use of insulin: Secondary | ICD-10-CM | POA: Diagnosis not present

## 2014-12-29 DIAGNOSIS — E7800 Pure hypercholesterolemia, unspecified: Secondary | ICD-10-CM | POA: Diagnosis present

## 2014-12-29 DIAGNOSIS — I10 Essential (primary) hypertension: Secondary | ICD-10-CM | POA: Diagnosis present

## 2014-12-29 DIAGNOSIS — Y838 Other surgical procedures as the cause of abnormal reaction of the patient, or of later complication, without mention of misadventure at the time of the procedure: Secondary | ICD-10-CM | POA: Diagnosis present

## 2014-12-29 DIAGNOSIS — S83004A Unspecified dislocation of right patella, initial encounter: Secondary | ICD-10-CM | POA: Diagnosis present

## 2014-12-29 DIAGNOSIS — E039 Hypothyroidism, unspecified: Secondary | ICD-10-CM | POA: Diagnosis present

## 2014-12-29 DIAGNOSIS — T84022A Instability of internal right knee prosthesis, initial encounter: Secondary | ICD-10-CM | POA: Diagnosis present

## 2014-12-29 DIAGNOSIS — Z96651 Presence of right artificial knee joint: Secondary | ICD-10-CM | POA: Diagnosis present

## 2014-12-29 DIAGNOSIS — Z7982 Long term (current) use of aspirin: Secondary | ICD-10-CM

## 2014-12-29 DIAGNOSIS — Z7984 Long term (current) use of oral hypoglycemic drugs: Secondary | ICD-10-CM | POA: Diagnosis not present

## 2014-12-29 DIAGNOSIS — Z6841 Body Mass Index (BMI) 40.0 and over, adult: Secondary | ICD-10-CM | POA: Diagnosis not present

## 2014-12-29 HISTORY — PX: MEDIAL PATELLOFEMORAL LIGAMENT REPAIR: SHX2020

## 2014-12-29 HISTORY — PX: KNEE RECONSTRUCTION, MEDIAL PATELLAR FEMORAL LIGAMENT: SHX1898

## 2014-12-29 LAB — CBC
HCT: 38.7 % (ref 36.0–46.0)
Hemoglobin: 12.6 g/dL (ref 12.0–15.0)
MCH: 27.6 pg (ref 26.0–34.0)
MCHC: 32.6 g/dL (ref 30.0–36.0)
MCV: 84.7 fL (ref 78.0–100.0)
PLATELETS: 185 10*3/uL (ref 150–400)
RBC: 4.57 MIL/uL (ref 3.87–5.11)
RDW: 13.5 % (ref 11.5–15.5)
WBC: 7.9 10*3/uL (ref 4.0–10.5)

## 2014-12-29 LAB — BASIC METABOLIC PANEL
Anion gap: 9 (ref 5–15)
BUN: 12 mg/dL (ref 6–20)
CHLORIDE: 101 mmol/L (ref 101–111)
CO2: 27 mmol/L (ref 22–32)
CREATININE: 0.72 mg/dL (ref 0.44–1.00)
Calcium: 9 mg/dL (ref 8.9–10.3)
GFR calc Af Amer: >60 mL/min (ref >60)
GLUCOSE: 184 mg/dL — AB (ref 65–99)
POTASSIUM: 3.8 mmol/L (ref 3.5–5.1)
SODIUM: 137 mmol/L (ref 135–145)

## 2014-12-29 LAB — GLUCOSE, CAPILLARY
GLUCOSE-CAPILLARY: 162 mg/dL — AB (ref 65–99)
GLUCOSE-CAPILLARY: 281 mg/dL — AB (ref 65–99)
Glucose-Capillary: 161 mg/dL — ABNORMAL HIGH (ref 65–99)
Glucose-Capillary: 196 mg/dL — ABNORMAL HIGH (ref 65–99)

## 2014-12-29 SURGERY — RECONSTRUCTION, LIGAMENT, MEDIAL PATELLOFEMORAL
Anesthesia: Regional | Site: Knee | Laterality: Right

## 2014-12-29 MED ORDER — OXYCODONE HCL 5 MG/5ML PO SOLN: 5.0000 mg | Freq: Once | ORAL | Status: DC | PRN

## 2014-12-29 MED ORDER — HYDROMORPHONE HCL 1 MG/ML IJ SOLN
INTRAMUSCULAR | Status: AC
  Filled 2014-12-29: qty 1

## 2014-12-29 MED ORDER — ALUM & MAG HYDROXIDE-SIMETH 200-200-20 MG/5ML PO SUSP
30.0000 mL | ORAL | Status: DC | PRN
  Administered 2014-12-30: 30 mL via ORAL
  Filled 2014-12-29: qty 30

## 2014-12-29 MED ORDER — METHOCARBAMOL 500 MG PO TABS
500.0000 mg | ORAL_TABLET | Freq: Four times a day (QID) | ORAL | Status: DC | PRN
  Administered 2014-12-30 – 2015-01-01 (×2): 500 mg via ORAL
  Filled 2014-12-29 (×2): qty 1

## 2014-12-29 MED ORDER — METOCLOPRAMIDE HCL 5 MG PO TABS: 5.0000 mg | ORAL_TABLET | Freq: Three times a day (TID) | ORAL | Status: DC | PRN

## 2014-12-29 MED ORDER — ONDANSETRON HCL 4 MG/2ML IJ SOLN: 4.0000 mg | Freq: Four times a day (QID) | INTRAMUSCULAR | Status: DC | PRN

## 2014-12-29 MED ORDER — MONTELUKAST SODIUM 10 MG PO TABS
10.0000 mg | ORAL_TABLET | Freq: Every day | ORAL | Status: DC
  Administered 2014-12-29 – 2014-12-31 (×3): 10 mg via ORAL
  Filled 2014-12-29 (×3): qty 1

## 2014-12-29 MED ORDER — SENNA 8.6 MG PO TABS
1.0000 | ORAL_TABLET | Freq: Two times a day (BID) | ORAL | Status: DC
  Administered 2014-12-29 – 2015-01-01 (×6): 8.6 mg via ORAL
  Filled 2014-12-29 (×6): qty 1

## 2014-12-29 MED ORDER — LISINOPRIL-HYDROCHLOROTHIAZIDE 20-12.5 MG PO TABS: 1.0000 | ORAL_TABLET | Freq: Every day | ORAL | Status: DC

## 2014-12-29 MED ORDER — PHENOL 1.4 % MT LIQD: 1.0000 | OROMUCOSAL | Status: DC | PRN

## 2014-12-29 MED ORDER — MIDAZOLAM HCL 2 MG/2ML IJ SOLN
1.0000 mg | INTRAMUSCULAR | Status: DC | PRN
  Administered 2014-12-29: 2 mg via INTRAVENOUS

## 2014-12-29 MED ORDER — FENTANYL CITRATE (PF) 100 MCG/2ML IJ SOLN
INTRAMUSCULAR | Status: AC
  Administered 2014-12-29: 100 ug via INTRAVENOUS
  Filled 2014-12-29: qty 2

## 2014-12-29 MED ORDER — PROPOFOL 10 MG/ML IV BOLUS
INTRAVENOUS | Status: AC
  Filled 2014-12-29: qty 40

## 2014-12-29 MED ORDER — BUPIVACAINE-EPINEPHRINE (PF) 0.5% -1:200000 IJ SOLN
INTRAMUSCULAR | Status: AC
  Filled 2014-12-29: qty 30

## 2014-12-29 MED ORDER — ONDANSETRON HCL 4 MG PO TABS
4.0000 mg | ORAL_TABLET | Freq: Four times a day (QID) | ORAL | Status: DC | PRN
  Administered 2014-12-30: 4 mg via ORAL
  Filled 2014-12-29: qty 1

## 2014-12-29 MED ORDER — SODIUM CHLORIDE 0.9 % IR SOLN
Status: DC | PRN
  Administered 2014-12-29: 1000 mL

## 2014-12-29 MED ORDER — GLYCOPYRROLATE 0.2 MG/ML IJ SOLN
INTRAMUSCULAR | Status: DC | PRN
  Administered 2014-12-29: .15 mg via INTRAVENOUS
  Administered 2014-12-29: .5 mg via INTRAVENOUS

## 2014-12-29 MED ORDER — STERILE WATER FOR INJECTION IJ SOLN
INTRAMUSCULAR | Status: AC
  Filled 2014-12-29: qty 10

## 2014-12-29 MED ORDER — ACETAMINOPHEN 650 MG RE SUPP: 650.0000 mg | Freq: Four times a day (QID) | RECTAL | Status: DC | PRN

## 2014-12-29 MED ORDER — SENNA-DOCUSATE SODIUM 8.6-50 MG PO TABS: 2.0000 | ORAL_TABLET | Freq: Every day | ORAL | Status: AC

## 2014-12-29 MED ORDER — HYDROCHLOROTHIAZIDE 12.5 MG PO CAPS
12.5000 mg | ORAL_CAPSULE | Freq: Every day | ORAL | Status: DC
  Administered 2014-12-29 – 2015-01-01 (×4): 12.5 mg via ORAL
  Filled 2014-12-29 (×4): qty 1

## 2014-12-29 MED ORDER — PHENYLEPHRINE HCL 10 MG/ML IJ SOLN
10.0000 mg | INTRAVENOUS | Status: DC | PRN
  Administered 2014-12-29: 30 ug via INTRAVENOUS

## 2014-12-29 MED ORDER — POLYETHYLENE GLYCOL 3350 17 G PO PACK: 17.0000 g | PACK | Freq: Every day | ORAL | Status: DC | PRN

## 2014-12-29 MED ORDER — EPHEDRINE SULFATE 50 MG/ML IJ SOLN
INTRAMUSCULAR | Status: AC
  Filled 2014-12-29: qty 1

## 2014-12-29 MED ORDER — ONDANSETRON HCL 4 MG PO TABS: 4.0000 mg | ORAL_TABLET | Freq: Three times a day (TID) | ORAL | Status: DC | PRN

## 2014-12-29 MED ORDER — HYDROMORPHONE HCL 1 MG/ML IJ SOLN: 1.0000 mg | INTRAMUSCULAR | Status: DC | PRN

## 2014-12-29 MED ORDER — OXYCODONE HCL 5 MG PO TABS
5.0000 mg | ORAL_TABLET | ORAL | Status: DC | PRN
  Administered 2014-12-30 – 2015-01-01 (×8): 10 mg via ORAL
  Filled 2014-12-29 (×8): qty 2

## 2014-12-29 MED ORDER — BISACODYL 10 MG RE SUPP: 10.0000 mg | Freq: Every day | RECTAL | Status: DC | PRN

## 2014-12-29 MED ORDER — METOCLOPRAMIDE HCL 5 MG/ML IJ SOLN: 5.0000 mg | Freq: Three times a day (TID) | INTRAMUSCULAR | Status: DC | PRN

## 2014-12-29 MED ORDER — ACETAMINOPHEN 325 MG PO TABS
650.0000 mg | ORAL_TABLET | Freq: Four times a day (QID) | ORAL | Status: DC | PRN
  Administered 2014-12-30: 650 mg via ORAL
  Filled 2014-12-29: qty 2

## 2014-12-29 MED ORDER — NEOSTIGMINE METHYLSULFATE 10 MG/10ML IV SOLN
INTRAVENOUS | Status: AC
  Filled 2014-12-29: qty 1

## 2014-12-29 MED ORDER — FENTANYL CITRATE (PF) 250 MCG/5ML IJ SOLN
INTRAMUSCULAR | Status: AC
Start: 2014-12-29 — End: 2014-12-29
  Filled 2014-12-29: qty 5

## 2014-12-29 MED ORDER — FENTANYL CITRATE (PF) 100 MCG/2ML IJ SOLN
50.0000 ug | INTRAMUSCULAR | Status: DC | PRN
  Administered 2014-12-29: 100 ug via INTRAVENOUS

## 2014-12-29 MED ORDER — METHOCARBAMOL 1000 MG/10ML IJ SOLN
500.0000 mg | Freq: Four times a day (QID) | INTRAVENOUS | Status: DC | PRN
  Filled 2014-12-29: qty 5

## 2014-12-29 MED ORDER — DIPHENHYDRAMINE HCL 12.5 MG/5ML PO ELIX: 12.5000 mg | ORAL_SOLUTION | ORAL | Status: DC | PRN

## 2014-12-29 MED ORDER — MIDAZOLAM HCL 2 MG/2ML IJ SOLN
INTRAMUSCULAR | Status: AC
  Filled 2014-12-29: qty 2

## 2014-12-29 MED ORDER — LACTATED RINGERS IV SOLN
INTRAVENOUS | Status: DC
  Administered 2014-12-29: 10:00:00 via INTRAVENOUS

## 2014-12-29 MED ORDER — PROPOFOL 10 MG/ML IV BOLUS
INTRAVENOUS | Status: DC | PRN
Start: 2014-12-29 — End: 2014-12-29
  Administered 2014-12-29: 200 mg via INTRAVENOUS

## 2014-12-29 MED ORDER — LEVOTHYROXINE SODIUM 100 MCG PO TABS
100.0000 ug | ORAL_TABLET | Freq: Every day | ORAL | Status: DC
  Administered 2014-12-30 – 2015-01-01 (×3): 100 ug via ORAL
  Filled 2014-12-29 (×3): qty 1

## 2014-12-29 MED ORDER — BACLOFEN 10 MG PO TABS: 10.0000 mg | ORAL_TABLET | Freq: Three times a day (TID) | ORAL | Status: DC

## 2014-12-29 MED ORDER — NEOSTIGMINE METHYLSULFATE 10 MG/10ML IV SOLN
INTRAVENOUS | Status: DC | PRN
  Administered 2014-12-29: 3.5 mg via INTRAVENOUS

## 2014-12-29 MED ORDER — CEFAZOLIN SODIUM-DEXTROSE 2-3 GM-% IV SOLR
2.0000 g | Freq: Four times a day (QID) | INTRAVENOUS | Status: AC
  Administered 2014-12-29 (×2): 2 g via INTRAVENOUS
  Filled 2014-12-29 (×2): qty 50

## 2014-12-29 MED ORDER — ROCURONIUM BROMIDE 50 MG/5ML IV SOLN
INTRAVENOUS | Status: AC
  Filled 2014-12-29: qty 2

## 2014-12-29 MED ORDER — LACTATED RINGERS IV SOLN: INTRAVENOUS | Status: DC

## 2014-12-29 MED ORDER — PHENYLEPHRINE 40 MCG/ML (10ML) SYRINGE FOR IV PUSH (FOR BLOOD PRESSURE SUPPORT)
PREFILLED_SYRINGE | INTRAVENOUS | Status: AC
  Filled 2014-12-29: qty 10

## 2014-12-29 MED ORDER — FENTANYL CITRATE (PF) 100 MCG/2ML IJ SOLN
INTRAMUSCULAR | Status: DC | PRN
  Administered 2014-12-29: 50 ug via INTRAVENOUS
  Administered 2014-12-29: 75 ug via INTRAVENOUS

## 2014-12-29 MED ORDER — OXYCODONE HCL 5 MG PO TABS: 5.0000 mg | ORAL_TABLET | Freq: Once | ORAL | Status: DC | PRN

## 2014-12-29 MED ORDER — MIDAZOLAM HCL 2 MG/2ML IJ SOLN
INTRAMUSCULAR | Status: AC
  Administered 2014-12-29: 2 mg via INTRAVENOUS
  Filled 2014-12-29: qty 2

## 2014-12-29 MED ORDER — BUPIVACAINE-EPINEPHRINE 0.5% -1:200000 IJ SOLN
INTRAMUSCULAR | Status: DC | PRN
  Administered 2014-12-29: 20 mL

## 2014-12-29 MED ORDER — ONDANSETRON HCL 4 MG/2ML IJ SOLN
INTRAMUSCULAR | Status: AC
Start: 2014-12-29 — End: 2014-12-29
  Filled 2014-12-29: qty 2

## 2014-12-29 MED ORDER — BUPIVACAINE-EPINEPHRINE (PF) 0.5% -1:200000 IJ SOLN
INTRAMUSCULAR | Status: DC | PRN
  Administered 2014-12-29: 30 mL via PERINEURAL

## 2014-12-29 MED ORDER — ONDANSETRON HCL 4 MG/2ML IJ SOLN
INTRAMUSCULAR | Status: DC | PRN
  Administered 2014-12-29: 4 mg via INTRAVENOUS

## 2014-12-29 MED ORDER — LIDOCAINE HCL (CARDIAC) 20 MG/ML IV SOLN
INTRAVENOUS | Status: AC
  Filled 2014-12-29: qty 5

## 2014-12-29 MED ORDER — POTASSIUM CHLORIDE IN NACL 20-0.45 MEQ/L-% IV SOLN
INTRAVENOUS | Status: DC
  Administered 2014-12-29: 15:00:00 via INTRAVENOUS
  Filled 2014-12-29 (×7): qty 1000

## 2014-12-29 MED ORDER — DOCUSATE SODIUM 100 MG PO CAPS
100.0000 mg | ORAL_CAPSULE | Freq: Two times a day (BID) | ORAL | Status: DC
Start: 2014-12-29 — End: 2015-01-01
  Administered 2014-12-29 – 2015-01-01 (×6): 100 mg via ORAL
  Filled 2014-12-29 (×6): qty 1

## 2014-12-29 MED ORDER — INSULIN ASPART 100 UNIT/ML ~~LOC~~ SOLN
0.0000 [IU] | Freq: Three times a day (TID) | SUBCUTANEOUS | Status: DC
  Administered 2014-12-29: 3 [IU] via SUBCUTANEOUS
  Administered 2014-12-30 (×3): 5 [IU] via SUBCUTANEOUS
  Administered 2014-12-31: 3 [IU] via SUBCUTANEOUS
  Administered 2014-12-31: 11 [IU] via SUBCUTANEOUS
  Administered 2014-12-31: 5 [IU] via SUBCUTANEOUS
  Administered 2015-01-01: 3 [IU] via SUBCUTANEOUS

## 2014-12-29 MED ORDER — OXYCODONE-ACETAMINOPHEN 5-325 MG PO TABS: 1.0000 | ORAL_TABLET | Freq: Four times a day (QID) | ORAL | Status: DC | PRN

## 2014-12-29 MED ORDER — GLYCOPYRROLATE 0.2 MG/ML IJ SOLN
INTRAMUSCULAR | Status: AC
  Filled 2014-12-29: qty 3

## 2014-12-29 MED ORDER — LACTATED RINGERS IV SOLN
INTRAVENOUS | Status: DC | PRN
  Administered 2014-12-29 (×2): via INTRAVENOUS

## 2014-12-29 MED ORDER — ASPIRIN EC 81 MG PO TBEC
81.0000 mg | DELAYED_RELEASE_TABLET | Freq: Every day | ORAL | Status: DC
  Administered 2014-12-29 – 2015-01-01 (×4): 81 mg via ORAL
  Filled 2014-12-29 (×4): qty 1

## 2014-12-29 MED ORDER — ONDANSETRON HCL 4 MG/2ML IJ SOLN
4.0000 mg | Freq: Four times a day (QID) | INTRAMUSCULAR | Status: DC | PRN
  Administered 2014-12-29: 4 mg via INTRAVENOUS

## 2014-12-29 MED ORDER — LABETALOL HCL 5 MG/ML IV SOLN
INTRAVENOUS | Status: DC | PRN
  Administered 2014-12-29 (×2): 5 mg via INTRAVENOUS

## 2014-12-29 MED ORDER — LISINOPRIL 20 MG PO TABS
20.0000 mg | ORAL_TABLET | Freq: Every day | ORAL | Status: DC
  Administered 2014-12-29 – 2015-01-01 (×4): 20 mg via ORAL
  Filled 2014-12-29 (×4): qty 1

## 2014-12-29 MED ORDER — LIDOCAINE HCL (CARDIAC) 20 MG/ML IV SOLN
INTRAVENOUS | Status: DC | PRN
  Administered 2014-12-29: 50 mg via INTRAVENOUS

## 2014-12-29 MED ORDER — ROCURONIUM BROMIDE 100 MG/10ML IV SOLN
INTRAVENOUS | Status: DC | PRN
  Administered 2014-12-29: 50 mg via INTRAVENOUS

## 2014-12-29 MED ORDER — HYDROMORPHONE HCL 1 MG/ML IJ SOLN
0.2500 mg | INTRAMUSCULAR | Status: DC | PRN
  Administered 2014-12-29 (×2): 0.5 mg via INTRAVENOUS

## 2014-12-29 MED ORDER — RIVAROXABAN 10 MG PO TABS
10.0000 mg | ORAL_TABLET | Freq: Every day | ORAL | Status: DC
  Administered 2014-12-30 – 2015-01-01 (×3): 10 mg via ORAL
  Filled 2014-12-29 (×5): qty 1

## 2014-12-29 MED ORDER — MAGNESIUM CITRATE PO SOLN: 1.0000 | Freq: Once | ORAL | Status: DC | PRN

## 2014-12-29 MED ORDER — MENTHOL 3 MG MT LOZG: 1.0000 | LOZENGE | OROMUCOSAL | Status: DC | PRN

## 2014-12-29 MED ORDER — EPHEDRINE SULFATE 50 MG/ML IJ SOLN
INTRAMUSCULAR | Status: DC | PRN
  Administered 2014-12-29 (×2): 5 mg via INTRAVENOUS

## 2014-12-29 SURGICAL SUPPLY — 55 items
BANDAGE ELASTIC 6 VELCRO ST LF (GAUZE/BANDAGES/DRESSINGS) IMPLANT
BANDAGE ESMARK 6X9 LF (GAUZE/BANDAGES/DRESSINGS) ×1 IMPLANT
BLADE GREAT WHITE 4.2 (BLADE) IMPLANT
BLADE GREAT WHITE 4.2MM (BLADE)
BLADE SURG 11 STRL SS (BLADE) IMPLANT
BLADE SURG ROTATE 9660 (MISCELLANEOUS) IMPLANT
BNDG ELASTIC 6X10 VLCR STRL LF (GAUZE/BANDAGES/DRESSINGS) ×3 IMPLANT
BNDG ESMARK 6X9 LF (GAUZE/BANDAGES/DRESSINGS) ×3
BOOTCOVER CLEANROOM LRG (PROTECTIVE WEAR) ×6 IMPLANT
CLOSURE WOUND 1/2 X4 (GAUZE/BANDAGES/DRESSINGS) ×1
COVER SURGICAL LIGHT HANDLE (MISCELLANEOUS) ×3 IMPLANT
CUFF TOURNIQUET SINGLE 34IN LL (TOURNIQUET CUFF) IMPLANT
CUTTER MENISCUS 3.5MM 6/BX (BLADE) IMPLANT
DRAPE ARTHROSCOPY W/POUCH 114 (DRAPES) IMPLANT
DRAPE U-SHAPE 47X51 STRL (DRAPES) ×3 IMPLANT
DRSG PAD ABDOMINAL 8X10 ST (GAUZE/BANDAGES/DRESSINGS) IMPLANT
GAUZE SPONGE 4X4 12PLY STRL (GAUZE/BANDAGES/DRESSINGS) IMPLANT
GAUZE XEROFORM 1X8 LF (GAUZE/BANDAGES/DRESSINGS) IMPLANT
GLOVE BIOGEL PI ORTHO PRO SZ8 (GLOVE) ×2
GLOVE ORTHO TXT STRL SZ7.5 (GLOVE) ×6 IMPLANT
GLOVE PI ORTHO PRO STRL SZ8 (GLOVE) ×1 IMPLANT
GLOVE SURG ORTHO 8.0 STRL STRW (GLOVE) ×6 IMPLANT
GOWN EXTRA PROTECTION XL (GOWNS) ×3 IMPLANT
GOWN EXTRA PROTECTION XXL 0583 (GOWNS) ×3 IMPLANT
GOWN STRL REUS W/ TWL LRG LVL3 (GOWN DISPOSABLE) IMPLANT
GOWN STRL REUS W/TWL LRG LVL3 (GOWN DISPOSABLE)
IMMOBILIZER KNEE 20 (SOFTGOODS) ×3
IMMOBILIZER KNEE 20 THIGH 36 (SOFTGOODS) ×1 IMPLANT
KIT ROOM TURNOVER OR (KITS) ×3 IMPLANT
MANIFOLD NEPTUNE II (INSTRUMENTS) IMPLANT
NEEDLE 18GX1X1/2 (RX/OR ONLY) (NEEDLE) IMPLANT
NEEDLE 22X1 1/2 (OR ONLY) (NEEDLE) IMPLANT
NEEDLE SPNL 18GX3.5 QUINCKE PK (NEEDLE) IMPLANT
NS IRRIG 1000ML POUR BTL (IV SOLUTION) ×3 IMPLANT
PACK ARTHROSCOPY DSU (CUSTOM PROCEDURE TRAY) IMPLANT
PACK ORTHO EXTREMITY (CUSTOM PROCEDURE TRAY) ×3 IMPLANT
PACK TOTAL JOINT (CUSTOM PROCEDURE TRAY) ×3 IMPLANT
PAD ABD 8X10 STRL (GAUZE/BANDAGES/DRESSINGS) ×3 IMPLANT
PAD ARMBOARD 7.5X6 YLW CONV (MISCELLANEOUS) ×6 IMPLANT
PADDING CAST COTTON 6X4 STRL (CAST SUPPLIES) ×3 IMPLANT
SET ARTHROSCOPY TUBING (MISCELLANEOUS)
SET ARTHROSCOPY TUBING LN (MISCELLANEOUS) IMPLANT
SPONGE GAUZE 4X4 12PLY STER LF (GAUZE/BANDAGES/DRESSINGS) ×3 IMPLANT
SPONGE LAP 4X18 X RAY DECT (DISPOSABLE) IMPLANT
STRIP CLOSURE SKIN 1/2X4 (GAUZE/BANDAGES/DRESSINGS) ×2 IMPLANT
SUT ETHILON 4 0 PS 2 18 (SUTURE) IMPLANT
SYR 20ML ECCENTRIC (SYRINGE) IMPLANT
SYR CONTROL 10ML LL (SYRINGE) IMPLANT
TOWEL OR 17X24 6PK STRL BLUE (TOWEL DISPOSABLE) ×3 IMPLANT
TOWEL OR 17X26 10 PK STRL BLUE (TOWEL DISPOSABLE) ×6 IMPLANT
TUBE CONNECTING 12'X1/4 (SUCTIONS)
TUBE CONNECTING 12X1/4 (SUCTIONS) IMPLANT
TUBING CYSTO DISP (UROLOGICAL SUPPLIES) ×3 IMPLANT
WAND HAND CNTRL MULTIVAC 90 (MISCELLANEOUS) IMPLANT
WATER STERILE IRR 1000ML POUR (IV SOLUTION) IMPLANT

## 2014-12-29 NOTE — H&P (Signed)
PREOPERATIVE H&P  Chief Complaint: DISLOCATION PATELLA  RIGHT    HPI: Danielle Ferguson is a 64 y.o. female who presents for preoperative history and physical with a diagnosis of DISLOCATION PATELLA  RIGHT  . Symptoms are rated as moderate to severe, and have been worsening.  This is significantly impairing activities of daily living.  She has elected for surgical management.    She had a TKA done about 1 1/2 yrs ago, with an acute post op fall, but  was lost to followup as she went almost directly to [place] for a year.  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  It is more of a vertical flip, than a true dislocation.        Past Medical History  Diagnosis Date  . High cholesterol     takes Simvastatin daily  . Chronic knee pain   . Shortness of breath     with walking  . Headache(784.0)   . Constipation   . PONV (postoperative nausea and vomiting)   . Arthritis     "back; knees" (04/10/2013)  . Chronic radicular pain of lower back     "right side" (04/10/2013)  . Hypertension     takes LIsinopril-HCTZ daily  . HTN (hypertension) 01/10/2013  . Type II diabetes mellitus (HCC)     takes AutoZoneFarxiga,Metformin,and Novolog daily  . Hypothyroidism     takes SYnthroid daily   Past Surgical History  Procedure Laterality Date  . Total knee arthroplasty Left 01/07/2013    Procedure: TOTAL KNEE ARTHROPLASTY;  Surgeon: Eulas PostJoshua P Jacky Hartung, MD;  Location: [facility] OR;  Service: Orthopedics;  Laterality: Left;  . Total knee arthroplasty Right 04/08/2013  . Appendectomy  ~ 2005  . Total knee arthroplasty Right 04/08/2013    Procedure: TOTAL KNEE ARTHROPLASTY;  Surgeon: Eulas PostJoshua P Rheya Minogue, MD;  Location: [facility] OR;  Service: Orthopedics;  Laterality: Right;   Social History   Social History  . Marital Status: Widowed    Spouse Name: N/A  . Number of Children: N/A  . Years of Education: N/A   Social History Main Topics  . Smoking status: Never Smoker   .  Smokeless tobacco: Never Used  . Alcohol Use: No  . Drug Use: No  . Sexual Activity: No   Other Topics Concern  . None   Social History Narrative   Family History  Problem Relation Age of Onset  . Diabetes Mother   . Diabetes Father    No Known Allergies Prior to Admission medications   Medication Sig Start Date End Date Taking? Authorizing Provider  aspirin 81 MG tablet Take 81 mg by mouth daily.   Yes Historical Provider, MD  insulin aspart protamine - aspart (NOVOLOG 70/30 MIX) (70-30) 100 UNIT/ML FlexPen Takes 40 units with meal in morning, 30 units at night with meal 10/09/14  Yes Shade FloodJeffrey R Greene, MD  levothyroxine (SYNTHROID, LEVOTHROID) 100 MCG tablet Take 1 tab by mouth daily on Mon, Wed, Fri, and 1/2 tab other days. 11/03/14  Yes Elvina SidleKurt Lauenstein, MD  lisinopril-hydrochlorothiazide (PRINZIDE,ZESTORETIC) 20-12.5 MG per tablet Take 1 tablet by mouth daily. 10/08/14  Yes Shade FloodJeffrey R Greene, MD  metFORMIN (GLUCOPHAGE) 1000 MG tablet Take 1 tablet (1,000 mg total) by mouth 2 (two) times daily with a meal. 10/08/14  Yes Shade FloodJeffrey R Greene, MD  montelukast (SINGULAIR) 10 MG tablet Take 1 tablet (10 mg total) by mouth at bedtime. 11/19/14  Yes Elvina SidleKurt Lauenstein, MD  naproxen (NAPROSYN) 500  MG tablet Take one twice daily at breakfast and supper when needed for pain and inflammation of back and sides 05/10/13  Yes Peyton Najjar, MD     Positive ROS: All other systems have been reviewed and were otherwise negative with the exception of those mentioned in the HPI and as above.  Physical Exam: General: Alert, no acute distress Cardiovascular: No pedal edema Respiratory: No cyanosis, no use of accessory musculature GI: No organomegaly, abdomen is soft and non-tender Skin: No lesions in the area of chief complaint Neurologic: Sensation intact distally Psychiatric: Patient is competent for consent with normal mood and affect Lymphatic: No axillary or cervical lymphadenopathy  MUSCULOSKELETAL:  right knee has well healed wounds, no  evidence for infection, but the patella flips 90 degrees when the knee bends past 80.    Assessment: DISLOCATION PATELLA  RIGHT, periprosthetic    Plan: Plan for Procedure(s): RIGHT MEDIAL PATELLA FEMORAL LIGAMENT RECONSTRUCTION vs. Retinacular repair.   The risks benefits and alternatives were discussed with the patient including but not limited to the risks of nonoperative treatment, versus surgical intervention including infection, bleeding, nerve injury,  blood clots, cardiopulmonary complications, morbidity, mortality, among others, and they were willing to proceed.   We have also discussed the risks for recurrent instability, need for partial patellectomy, the potential for fracture of the patellar component, complications and inability to regain function of the knee, among others.   Eulas Post, MD Cell [phone]   12/29/2014 9:53 AM

## 2014-12-29 NOTE — Transfer of Care (Signed)
Immediate Anesthesia Transfer of Care Note  Patient: Danielle Ferguson  Procedure(s) Performed: Procedure(s): RIGHT MEDIAL PATELLA FEMORAL LIGAMENT RECONSTRUCTION (Right)  Patient Location: PACU  Anesthesia Type:General and GA combined with regional for post-op pain  Level of Consciousness: awake, alert , oriented and patient cooperative  Airway & Oxygen Therapy: Patient Spontanous Breathing and Patient connected to face mask oxygen  Post-op Assessment: Report given to RN and Post -op Vital signs reviewed and stable  Post vital signs: Reviewed and stable  Last Vitals:  Filed Vitals:   12/29/14 1025 12/29/14 1030  BP: 199/80 154/61  Pulse: 108 104  Temp:    Resp: 29 21    Complications: No apparent anesthesia complications

## 2014-12-29 NOTE — Anesthesia Procedure Notes (Addendum)
Anesthesia Regional Block:  Femoral nerve block  Pre-Anesthetic Checklist: ,, timeout performed, Correct Patient, Correct Site, Correct Laterality, Correct Procedure,, site marked, risks and benefits discussed, Surgical consent,  Pre-op evaluation,  At surgeon's request and post-op pain management  Laterality: Right  Prep: chloraprep       Needles:  Injection technique: Single-shot  Needle Type: Echogenic Stimulator Needle     Needle Length: 9cm 9 cm Needle Gauge: 21 and 21 G    Additional Needles:  Procedures: nerve stimulator Femoral nerve block  Nerve Stimulator or Paresthesia:  Response: Quadriceps muscle contraction, 0.45 mA,   Additional Responses:   Narrative:  Start time: 12/29/2014 10:24 AM End time: 12/29/2014 10:34 AM Injection made incrementally with aspirations every 5 mL.  Performed by: Personally  Anesthesiologist: HODIERNE, ADAM  Additional Notes: Functioning IV was confirmed and monitors were applied.  A 90mm 21ga Arrow echogenic stimulator needle was used. Sterile prep and drape,hand hygiene and sterile gloves were used.  Negative aspiration and negative test dose prior to incremental administration of local anesthetic. The patient tolerated the procedure well.     Procedure Name: Intubation Date/Time: 12/29/2014 11:04 AM Performed by: Wilder GladeWINN, Kristapher [place] G Pre-anesthesia Checklist: Patient identified, Emergency Drugs available, Suction available, Patient being monitored and Timeout performed Patient Re-evaluated:Patient Re-evaluated prior to inductionOxygen Delivery Method: Circle system utilized Preoxygenation: Pre-oxygenation with 100% oxygen Intubation Type: IV induction Ventilation: Mask ventilation without difficulty Laryngoscope Size: Miller and 2 Grade View: Grade I Tube type: Oral Tube size: 7.5 mm Number of attempts: 1 Airway Equipment and Method: Stylet Placement Confirmation: ETT inserted through vocal cords under direct vision,  positive  ETCO2 and breath sounds checked- equal and bilateral Secured at: 23 cm Tube secured with: Tape Dental Injury: Teeth and Oropharynx as per pre-operative assessment

## 2014-12-29 NOTE — Anesthesia Preprocedure Evaluation (Signed)
Anesthesia Evaluation  Patient identified by MRN, date of birth, ID band Patient awake    Reviewed: Allergy & Precautions, NPO status , Patient's Chart, lab work & pertinent test results  History of Anesthesia Complications (+) PONV and history of anesthetic complications  Airway Mallampati: III   Neck ROM: full    Dental   Pulmonary shortness of breath,    breath sounds clear to auscultation       Cardiovascular hypertension,  Rhythm:regular Rate:Normal     Neuro/Psych  Headaches,  Neuromuscular disease    GI/Hepatic   Endo/Other  diabetes, Type 2Hypothyroidism Morbid obesity  Renal/GU      Musculoskeletal  (+) Arthritis ,   Abdominal   Peds  Hematology   Anesthesia Other Findings   Reproductive/Obstetrics                             Anesthesia Physical Anesthesia Plan  ASA: II  Anesthesia Plan: General and Regional   Post-op Pain Management: MAC Combined w/ Regional for Post-op pain   Induction: Intravenous  Airway Management Planned: Oral ETT  Additional Equipment:   Intra-op Plan:   Post-operative Plan: Extubation in OR  Informed Consent: I have reviewed the patients History and Physical, chart, labs and discussed the procedure including the risks, benefits and alternatives for the proposed anesthesia with the patient or authorized representative who has indicated his/her understanding and acceptance.     Plan Discussed with: CRNA, Anesthesiologist and Surgeon  Anesthesia Plan Comments:         Anesthesia Quick Evaluation

## 2014-12-29 NOTE — Op Note (Signed)
12/29/2014  12:25 PM  PATIENT:  Danielle Ferguson    PRE-OPERATIVE DIAGNOSIS:  Right medial retinacular disruption, prosthetic patellar instability  POST-OPERATIVE DIAGNOSIS:  Same  PROCEDURE:  Right knee medial patellofemoral ligament repair/retinacular repair  SURGEON:  Eulas PostLANDAU,Oumar Marcott P, MD  PHYSICIAN ASSISTANT: Janace LittenBrandon Parry, OPA-C, present and scrubbed throughout the case, critical for completion in a timely fashion, and for retraction, instrumentation, and closure.  ANESTHESIA:   General  PREOPERATIVE INDICATIONS:  Danielle Ferguson is a  64 y.o. female who had a right total knee replacement done approximately a year and a half ago. She fell during the early postoperative period, and shortly thereafter went to [place]. She remained there for about a year, and upon presentation back to [place] complained of persistent dysfunction of the patella when getting out of a chair and from a deep knee bend. Preoperative x-rays demonstrated substantial lateral tilting of the patella with disruption of the medial soft tissue restraint. She elected for surgical management.  The risks benefits and alternatives were discussed with the patient preoperatively including but not limited to the risks of infection, bleeding, nerve injury, cardiopulmonary complications, the need for revision surgery, among others, and the patient was willing to proceed. We discussed the potential need to use grafts, versus patellectomy, depending on operative findings.  OPERATIVE IMPLANTS: I used a interrupted figure-of-eight #2 FiberWire for the retinacular repair and imbrication.  OPERATIVE FINDINGS: The quadriceps tendon was still attached to the patella, however the tear had extended from the apex just above the patella and then extended around medially and effectively the entire length of the patella. There was significant instability of the patella in deep flexion. The tissue quality was still reasonably  good however, I did excise about 1.5 cm in the mediolateral plane of pseudocapsule to provide appropriate imbrication of the medial retinacular tissue. After all of the FiberWire was in, the soft tissue tension was appropriate and the patella tracked well. There is no evidence for infection, no evidence for loosening of the patellar component, no evidence for loosening of the tibial or femoral component either. There was a fairly substantial meniscus that had overgrown the patellar component which I excised during the exploration.  OPERATIVE PROCEDURE: The patient is brought to the operating room and placed in the supine position. Gen. anesthesia was administered. IV antibiotics were given. The right lower extremity was prepped and draped in usual sterile fashion. Time out was performed. The leg was elevated and exsanguinated and the tourniquet was inflated. Anterior midline approach was carried out and I used sharp dissection to elevate the layer off of the quadriceps tendon and the medial parapatellar tissue. There was pseudocapsule that had scarred in where the previous repair was, and this was palpable as a significant defect in quality tissue over the quadriceps and medial retinacular tissue.  I performed an arthrotomy, evaluated the tissue, resected about 1.5 cm of medial tissue in order to excise the pseudocapsule. I then assessed the mobility of the patella, and the components all looks good although there was a pseudomembrane that was overlying the patellar implant. I did excise the pseudomembrane and the patellar implant was inspected closely and found to be in good condition and was stable.  I then placed towel clips on the retinacular tissue to assess the tracking, which demonstrated excellent tracking with normal stabilization of the patella. This did not evert as it did preoperatively.  I therefore irrigated the knee copiously, and then used a series of interrupted figure-of-eight #2  FiberWire  suture to imbricate and repaired the capsule going from the apex of the defect up above the quadriceps tendon down around the patella and to the tibial tubercle. The knee now had normal patellar mobility and good stability and I irrigated the wounds copiously once more and repaired the subtendinous tissue with Vicryl followed by routine closure with the skin and Steri-Strips and sterile gauze. She was awakened and returned to the PACU in stable and satisfactory condition. There were no complications and she tolerated the procedure well.

## 2014-12-29 NOTE — Progress Notes (Signed)
Patient hypertensive SBP >180, anesthesiologist aware, will continue to monitor.

## 2014-12-30 ENCOUNTER — Encounter (HOSPITAL_COMMUNITY): Payer: Self-pay | Admitting: Orthopedic Surgery

## 2014-12-30 LAB — BASIC METABOLIC PANEL
ANION GAP: 7 (ref 5–15)
BUN: 15 mg/dL (ref 6–20)
CALCIUM: 8.3 mg/dL — AB (ref 8.9–10.3)
CO2: 28 mmol/L (ref 22–32)
Chloride: 101 mmol/L (ref 101–111)
Creatinine, Ser: 0.97 mg/dL (ref 0.44–1.00)
GLUCOSE: 216 mg/dL — AB (ref 65–99)
Potassium: 3.9 mmol/L (ref 3.5–5.1)
SODIUM: 136 mmol/L (ref 135–145)

## 2014-12-30 LAB — CBC
HCT: 34.1 % — ABNORMAL LOW (ref 36.0–46.0)
HEMOGLOBIN: 10.8 g/dL — AB (ref 12.0–15.0)
MCH: 27.4 pg (ref 26.0–34.0)
MCHC: 31.7 g/dL (ref 30.0–36.0)
MCV: 86.5 fL (ref 78.0–100.0)
Platelets: 189 10*3/uL (ref 150–400)
RBC: 3.94 MIL/uL (ref 3.87–5.11)
RDW: 13.8 % (ref 11.5–15.5)
WBC: 7.7 10*3/uL (ref 4.0–10.5)

## 2014-12-30 LAB — GLUCOSE, CAPILLARY
GLUCOSE-CAPILLARY: 210 mg/dL — AB (ref 65–99)
GLUCOSE-CAPILLARY: 195 mg/dL — AB (ref 65–99)
Glucose-Capillary: 224 mg/dL — ABNORMAL HIGH (ref 65–99)
Glucose-Capillary: 231 mg/dL — ABNORMAL HIGH (ref 65–99)

## 2014-12-30 MED ORDER — SODIUM CHLORIDE 0.9 % IV SOLN
Freq: Once | INTRAVENOUS | Status: AC
  Administered 2014-12-30: 10:00:00 via INTRAVENOUS

## 2014-12-30 NOTE — Care Management Note (Signed)
Case Management Note  Patient Details  Name: Danielle Ferguson MRN: [id number] Date of Birth: [birth year]  Subjective/Objective:                    Action/Plan:  Spoke to patient and son Danielle Ferguson [phone] at bedside. Choice offered . Referral made to [facility] .  Expected Discharge Date:                  Expected Discharge Plan:  Home w Home Health Services  In-House Referral:     Discharge planning Services  CM Consult  Post Acute Care Choice:  Home Health Choice offered to:  Patient, Adult Children  DME Arranged:  3-N-1, Walker rolling DME Agency:  [facility]  HH Arranged:  PT, OT HH Agency:  [facility]  Status of Service:  Completed, signed off  Medicare Important Message Given:    Date Medicare IM Given:    Medicare IM give by:    Date Additional Medicare IM Given:    Additional Medicare Important Message give by:     If discussed at Long Length of Stay Meetings, dates discussed:    Additional Comments:  Kingsley PlanWile, Dionne Knoop Marie, RN 12/30/2014, 10:50 AM

## 2014-12-30 NOTE — Progress Notes (Addendum)
Inpatient Diabetes Program Recommendations  AACE/ADA: New Consensus Statement on Inpatient Glycemic Control (2015)  Target Ranges:  Prepandial:   less than 140 mg/dL      Peak postprandial:   less than 180 mg/dL (1-2 hours)      Critically ill patients:  140 - 180 mg/dL    Results for Lisette GrinderLSHAMMARI, [facility] (MRN [id number]) as of 12/30/2014 10:37  Ref. Range 12/29/2014 08:54 12/29/2014 12:55 12/29/2014 16:54 12/29/2014 21:03  Glucose-Capillary Latest Ref Range: 65-99 mg/dL 409161 (H) 811162 (H) 914196 (H) 281 (H)    Results for Lisette GrinderLSHAMMARI, [facility] (MRN [id number]) as of 12/30/2014 10:37  Ref. Range 12/30/2014 07:32  Glucose-Capillary Latest Ref Range: 65-99 mg/dL 086210 (H)     Admit with: Patellar Dislocation  History: DM  Home DM Meds: Novolog 70/30 insulin- 40 units AM/ 30 units PM       Metformin 1000 mg bid  Current Insulin Orders: Novolog Moderate SSI (0-15 units) TID AC     MD- Note patient started on Carbohydrate Modified diet today.  Please start at least 50% of patient's home dose of 70/30 insulin-  20 units with breakfast and 15 units with dinner     --Will follow patient during hospitalization--  Ambrose FinlandJeannine Johnston Britainy Kozub RN, MSN, CDE Diabetes Coordinator Inpatient Glycemic Control Team Team Pager: [phone] (8a-5p)

## 2014-12-30 NOTE — Evaluation (Signed)
Physical Therapy Evaluation Patient Details Name: Danielle FiguresMonifah Ferguson MRN: [id number] DOB: 1950/04/28 Today's Date: 12/30/2014   History of Present Illness  64 y.o. female [facility] for unstable R patella in the setting of TKA ~1.5 years ago with post op fall and instability.  Pt s/p R knee retinacular repair and medial patellofemoral ligament repair.  Pt with significant PMHx of SOB, chronic low back pain, HTN, DM2, and bil TKA.  Clinical Impression  Pt is POD #1 and is moving well, albeit, short in-room distances with RW.  Pt is overall min assist.  I anticipate that she will be able to progress well enough to d/c home with family's assist and HHPT.  We will initiate stair training tomorrow.   PT to follow acutely for deficits listed below.       Follow Up Recommendations Home health PT;Supervision for mobility/OOB    Equipment Recommendations  Rolling walker with 5" wheels;3in1 (PT)    Recommendations for Other Services   NA    Precautions / Restrictions Precautions Precautions: Fall;Knee Precaution Comments: NO KNEE FLEXION ROM Required Braces or Orthoses: Knee Immobilizer - Right Knee Immobilizer - Right: On at all times;Other (comment) (except when needed for hygiene) Restrictions Weight Bearing Restrictions: Yes RLE Weight Bearing: Weight bearing as tolerated      Mobility  Bed Mobility Overal bed mobility: Needs Assistance Bed Mobility: Supine to Sit     Supine to sit: Min assist     General bed mobility comments: Min assist to support right leg to move EOB.  Assist also needed at trunk to get to sitting.   Transfers Overall transfer level: Needs assistance Equipment used: Rolling walker (2 wheeled) Transfers: Sit to/from Stand Sit to Stand: Min assist         General transfer comment: Min assist to support trunk during transitions to support trunk.  Pt needs verbal cues for safe hand placement and tends to "park" RW and take a few more  steps before sitting down.  Tried to reinforce safe RW use during transitions.   Ambulation/Gait Ambulation/Gait assistance: Min assist Ambulation Distance (Feet): 15 Feet Assistive device: Rolling walker (2 wheeled) Gait Pattern/deviations: Step-to pattern;Antalgic;Trunk flexed Gait velocity: decreased   General Gait Details: Pt with moderately antalgic gait pattern.  Verbal cues for safe proximity to RW and upright posture as well as safe RW use.          Balance Overall balance assessment: Needs assistance Sitting-balance support: Feet supported;No upper extremity supported Sitting balance-Leahy Scale: Good     Standing balance support: Bilateral upper extremity supported Standing balance-Leahy Scale: Poor Standing balance comment: needs support of RW and PT                             Pertinent Vitals/Pain Pain Assessment: Faces Faces Pain Scale: Hurts little more Pain Location: right knee with mobility and WB Pain Descriptors / Indicators: Aching Pain Intervention(s): Limited activity within patient's tolerance;Monitored during session;Repositioned    Home Living Family/patient expects to be discharged to:: Private residence Living Arrangements: Children;Other relatives Available Help at Discharge: Family;Available 24 hours/day Type of Home: Apartment Home Access: Stairs to enter Entrance Stairs-Rails: Left Entrance Stairs-Number of Steps: flight Home Layout: One level Home Equipment: Cane - single point      Prior Function Level of Independence: Independent with assistive device(s)         Comments: uses cane to walk  Extremity/Trunk Assessment   Upper Extremity Assessment: Defer to OT evaluation           Lower Extremity Assessment: RLE deficits/detail RLE Deficits / Details: right leg with normal post op pain and weakness.  Knee is immobilized in brace.  Hip flexion 2/5    Cervical / Trunk Assessment: Other exceptions   Communication   Communication: Prefers language other than English  Cognition Arousal/Alertness: Awake/alert Behavior During Therapy: WFL for tasks assessed/performed Overall Cognitive Status: Within Functional Limits for tasks assessed                         Exercises Total Joint Exercises Ankle Circles/Pumps: AROM;Both;10 reps Quad Sets: AROM;Right;10 reps Towel Squeeze: AROM;Both;10 reps Hip ABduction/ADduction: AAROM;Right;10 reps Straight Leg Raises: AAROM;Right;10 reps      Assessment/Plan    PT Assessment Patient needs continued PT services  PT Diagnosis Difficulty walking;Abnormality of gait;Generalized weakness;Acute pain   PT Problem List Decreased strength;Decreased range of motion;Decreased activity tolerance;Decreased balance;Decreased mobility;Decreased knowledge of use of DME;Pain  PT Treatment Interventions DME instruction;Stair training;Gait training;Functional mobility training;Therapeutic activities;Therapeutic exercise;Balance training;Neuromuscular re-education;Patient/family education;Modalities   PT Goals (Current goals can be found in the Care Plan section) Acute Rehab PT Goals Patient Stated Goal: none stated during initial assessment.  PT Goal Formulation: With family Time For Goal Achievement: 01/06/15 Potential to Achieve Goals: Good    Frequency 7X/week           End of Session Equipment Utilized During Treatment: Right knee immobilizer Activity Tolerance: Patient limited by fatigue;Patient limited by pain Patient left: in chair;with call bell/phone within reach;with family/visitor present Nurse Communication: Mobility status;Other (comment) (confirmed no knee flexion and KI at all times per MD)         Time: 0454-0981 PT Time Calculation (min) (ACUTE ONLY): 22 min   Charges:   PT Evaluation $Initial PT Evaluation Tier I: 1 Procedure          Elisia Stepp B. Jamonte Curfman, PT, DPT [phone]   12/30/2014, 3:23 PM

## 2014-12-30 NOTE — Progress Notes (Signed)
     Subjective:  Patient reports pain as mild.  She has a great fear of falling.  Doesn't feel ready to go home.   Objective:   VITALS:   Filed Vitals:   12/29/14 1815 12/29/14 2053 12/30/14 0122 12/30/14 0627  BP: 136/56 153/65 123/66 155/79  Pulse: 92 88 87 89  Temp: 98.4 F (36.9 C) 98 F (36.7 C) 98.3 F (36.8 C) 97.7 F (36.5 C)  TempSrc: Oral Oral Oral Oral  Resp: 19 18 18 18   Height:      Weight:      SpO2: 100% 96% 99% 93%    Neurologically intact Dorsiflexion/Plantar flexion intact Incision: no drainage   Lab Results  Component Value Date   WBC 7.7 12/30/2014   HGB 10.8* 12/30/2014   HCT 34.1* 12/30/2014   MCV 86.5 12/30/2014   PLT 189 12/30/2014   BMET    Component Value Date/Time   NA 136 12/30/2014 0555   K 3.9 12/30/2014 0555   CL 101 12/30/2014 0555   CO2 28 12/30/2014 0555   GLUCOSE 216* 12/30/2014 0555   BUN 15 12/30/2014 0555   CREATININE 0.97 12/30/2014 0555   CREATININE 0.94 11/19/2014 1939   CALCIUM 8.3* 12/30/2014 0555   GFRNONAA >60 12/30/2014 0555   GFRNONAA 65 11/19/2014 1939   GFRAA >60 12/30/2014 0555   GFRAA 75 11/19/2014 1939     Assessment/Plan: 1 Day Post-Op   Principal Problem:   Recurrent dislocation of right patella Active Problems:   Dislocation of right patella   Advance diet Up with therapy Probable dc home fri, depending on progress with PT.  Needs to be able to do stairs.  She is very anxious and "is planning to stay for a long time".  The family's concerns are that she needs to be very safe before discharge.       Rosealee Recinos P 12/30/2014, 8:04 AM   Teryl LucyJoshua Gracen Ringwald, MD Cell [phone]

## 2014-12-30 NOTE — Progress Notes (Signed)
Patient c/o IV site hurting and request for it to be removed. She doesn't want to be stuck again. MD notified, made aware and approves of decision.

## 2014-12-31 LAB — GLUCOSE, CAPILLARY
GLUCOSE-CAPILLARY: 176 mg/dL — AB (ref 65–99)
Glucose-Capillary: 248 mg/dL — ABNORMAL HIGH (ref 65–99)
Glucose-Capillary: 315 mg/dL — ABNORMAL HIGH (ref 65–99)
Glucose-Capillary: 163 mg/dL — ABNORMAL HIGH (ref 65–99)

## 2014-12-31 MED ORDER — INSULIN ASPART PROT & ASPART (70-30 MIX) 100 UNIT/ML ~~LOC~~ SUSP
20.0000 [IU] | Freq: Every day | SUBCUTANEOUS | Status: DC
  Administered 2014-12-31: 20 [IU] via SUBCUTANEOUS
  Filled 2014-12-31: qty 10

## 2014-12-31 MED ORDER — INSULIN ASPART PROT & ASPART (70-30 MIX) 100 UNIT/ML ~~LOC~~ SUSP
25.0000 [IU] | Freq: Every day | SUBCUTANEOUS | Status: DC
  Administered 2015-01-01: 25 [IU] via SUBCUTANEOUS
  Filled 2014-12-31: qty 10

## 2014-12-31 NOTE — Discharge Instructions (Addendum)
Diet: As you were doing prior to hospitalization   Shower:  May shower but keep the wounds dry, use an occlusive plastic wrap, NO SOAKING IN TUB.  If the bandage gets wet, change with a clean dry gauze.  If you have a splint on, leave the splint in place and keep the splint dry with a plastic bag.  Dressing:  You may change your dressing 3-5 days after surgery, unless you have a splint.  If you have a splint, then just leave the splint in place and we will change your bandages during your first follow-up appointment.    If you had hand or foot surgery, we will plan to remove your stitches in about 2 weeks in the office.  For all other surgeries, there are sticky tapes (steri-strips) on your wounds and all the stitches are absorbable.  Leave the steri-strips in place when changing your dressings, they will peel off with time, usually 2-3 weeks.  Activity:  Increase activity slowly as tolerated, but follow the weight bearing instructions below.  The rules on driving is that you can not be taking narcotics while you drive, and you must feel in control of the vehicle.    Weight Bearing:   As tolerated, stay in knee immobilizer.    To prevent constipation: you may use a stool softener such as -  Colace (over the counter) 100 mg by mouth twice a day  Drink plenty of fluids (prune juice may be helpful) and high fiber foods Miralax (over the counter) for constipation as needed.    Itching:  If you experience itching with your medications, try taking only a single pain pill, or even half a pain pill at a time.  You may take up to 10 pain pills per day, and you can also use benadryl over the counter for itching or also to help with sleep.   Precautions:  If you experience chest pain or shortness of breath - call 911 immediately for transfer to the hospital emergency department!!  If you develop a fever greater that 101 F, purulent drainage from wound, increased redness or drainage from wound, or calf  pain -- Call the office at [phone]                                                Follow- Up Appointment:  Please call for an appointment to be seen in 2 weeks [place] - [phone]    Information on my medicine - XARELTO (Rivaroxaban)  This medication education was reviewed with me or my healthcare representative as part of my discharge preparation.  The pharmacist that spoke with me during my hospital stay was:  Fayne NorrieMillen, Tyresha Fede Brown, [facility]  Why was Xarelto prescribed for you? Xarelto was prescribed for you to reduce the risk of blood clots forming after orthopedic surgery. The medical term for these abnormal blood clots is venous thromboembolism (VTE).  What do you need to know about xarelto ? Take your Xarelto ONCE DAILY at the same time every day. You may take it either with or without food.  If you have difficulty swallowing the tablet whole, you may crush it and mix in applesauce just prior to taking your dose.  Take Xarelto exactly as prescribed by your doctor and DO NOT stop taking Xarelto without talking to the doctor who prescribed the medication.  Stopping  without other VTE prevention medication to take the place of Xarelto may increase your risk of developing a clot.  After discharge, you should have regular check-up appointments with your healthcare provider that is prescribing your Xarelto.    What do you do if you miss a dose? If you miss a dose, take it as soon as you remember on the same day then continue your regularly scheduled once daily regimen the next day. Do not take two doses of Xarelto on the same day.   Important Safety Information A possible side effect of Xarelto is bleeding. You should call your healthcare provider right away if you experience any of the following: ? Bleeding from an injury or your nose that does not stop. ? Unusual colored urine (red or dark brown) or unusual colored stools (red or black). ? Unusual bruising for unknown  reasons. ? A serious fall or if you hit your head (even if there is no bleeding).  Some medicines may interact with Xarelto and might increase your risk of bleeding while on Xarelto. To help avoid this, consult your healthcare provider or pharmacist prior to using any new prescription or non-prescription medications, including herbals, vitamins, non-steroidal anti-inflammatory drugs (NSAIDs) and supplements.  This website has more information on Xarelto: VisitDestination.com.br.

## 2014-12-31 NOTE — Progress Notes (Signed)
Patient ID: Danielle Ferguson, female   DOB: 04-Mar-1950, 64 y.o.   MRN: [id number]     Subjective:  Patient reports pain as mild.  Patient in bed and in no acute distress.  Denies any CP or SOB   Objective:   VITALS:   Filed Vitals:   12/30/14 1430 12/30/14 2101 12/31/14 0648 12/31/14 1425  BP: 152/56 156/54 112/50 137/53  Pulse: 86 98 111 92  Temp: 97.9 F (36.6 C) 98.9 F (37.2 C) 100.4 F (38 C) 99.4 F (37.4 C)  TempSrc: Oral Oral Oral Oral  Resp: 18 20 18 18   Height:      Weight:      SpO2: 96% 96% 94% 99%    ABD soft Sensation intact distally Dorsiflexion/Plantar flexion intact Incision: dressing C/D/I and no drainage   Lab Results  Component Value Date   WBC 7.7 12/30/2014   HGB 10.8* 12/30/2014   HCT 34.1* 12/30/2014   MCV 86.5 12/30/2014   PLT 189 12/30/2014   BMET    Component Value Date/Time   NA 136 12/30/2014 0555   K 3.9 12/30/2014 0555   CL 101 12/30/2014 0555   CO2 28 12/30/2014 0555   GLUCOSE 216* 12/30/2014 0555   BUN 15 12/30/2014 0555   CREATININE 0.97 12/30/2014 0555   CREATININE 0.94 11/19/2014 1939   CALCIUM 8.3* 12/30/2014 0555   GFRNONAA >60 12/30/2014 0555   GFRNONAA 65 11/19/2014 1939   GFRAA >60 12/30/2014 0555   GFRAA 75 11/19/2014 1939     Assessment/Plan: 2 Days Post-Op   Principal Problem:   Recurrent dislocation of right patella Active Problems:   Dislocation of right patella   Advance diet Up with therapy Plan for discharge tomorrow Plan dressing change tomorrow Leotis ShamesWBAT    DOUGLAS PARRY, BRANDON 12/31/2014, 5:24 PM  Discussed and agree with above.  DM: adjusted insulin per recs from DM team.   Teryl LucyJoshua Qiana Landgrebe, MD Cell [phone]

## 2014-12-31 NOTE — Progress Notes (Signed)
Inpatient Diabetes Program Recommendations  AACE/ADA: New Consensus Statement on Inpatient Glycemic Control (2015)  Target Ranges:  Prepandial:   less than 140 mg/dL      Peak postprandial:   less than 180 mg/dL (1-2 hours)      Critically ill patients:  140 - 180 mg/dL   Review of Glycemic Control:  Results for Danielle Ferguson, Danielle Ferguson (MRN [id number]) as of 12/31/2014 13:02  Ref. Range 12/30/2014 12:55 12/30/2014 16:41 12/30/2014 21:33 12/31/2014 07:37 12/31/2014 11:44  Glucose-Capillary Latest Ref Range: 65-99 mg/dL 409224 (H) 811231 (H) 914195 (H) 248 (H) 315 (H)   Diabetes history:  DM Outpatient Diabetes medications:                             Novolog 70/30 insulin- 40 units AM/ 30 units PM  Metformin 1000 mg bid Current orders for Inpatient glycemic control:                              Nov[facility] Moderate [facility] (0-15 units) [facility]  Inpatient Diabetes Program Recommendations:    Please consider restarting a portion of patient's home dose of Novolog 70/30.  Consider 25 units Novolog 70/30 q AM with breakfast and 20 units Novolog 70/30 with supper.  Called and discussed with Dr. Dion SaucierLandau and orders received.  Thanks, Beryl MeagerJenny Neema Fluegge, RN, BC-ADM Inpatient Diabetes Coordinator Pager [phone] (8a-5p)

## 2014-12-31 NOTE — Evaluation (Signed)
Occupational Therapy Evaluation Patient Details Name: Danielle Ferguson MRN: [id number] DOB: [birth year] Today's Date: 12/31/2014    History of Present Illness 64 y.o. female admitted to [facility] on 12/29/14 for unstable R patella in the setting of TKA ~1.5 years ago with post op fall and instability.  Pt s/p R knee retinacular repair and medial patellofemoral ligament repair.  Pt with significant PMHx of SOB, chronic low back pain, HTN, DM2, and bil TKA.   Clinical Impression   Pt admitted with above. He demonstrates the below listed deficits and will benefit from continued OT to maximize safety and independence with BADLs.  Family supportive.  Pt fatigued this pm, so focused on education.  Will instruct pt on use of AE in the am.  Recommend 3in1 commode at discharge.       Follow Up Recommendations  Home health OT;Supervision/Assistance - 24 hour    Equipment Recommendations  3 in 1 bedside comode    Recommendations for Other Services       Precautions / Restrictions Precautions Precautions: Fall;Knee Precaution Booklet Issued: Yes (comment) Precaution Comments: NO KNEE FLEXION ROM Required Braces or Orthoses: Knee Immobilizer - Right Knee Immobilizer - Right: On at all times;Other (comment) (except when preforming hygiene) Restrictions Weight Bearing Restrictions: Yes RLE Weight Bearing: Weight bearing as tolerated      Mobility Bed Mobility Overal bed mobility: Needs Assistance Bed Mobility: Supine to Sit     Supine to sit: Min assist;HOB elevated     General bed mobility comments: Min assist to help progress right leg over EOB.  Pt using bed rails and HOB maximally elevated.  Will need to practice with HOB flat and no rails tomorrow.   Transfers Overall transfer level: Needs assistance Equipment used: Rolling walker (2 wheeled) Transfers: Sit to/from Stand Sit to Stand: Min assist         General transfer comment: Pt deferred this date     Balance Overall  balance assessment: Needs assistance Sitting-balance support: Feet supported;No upper extremity supported Sitting balance-Leahy Scale: Good     Standing balance support: Bilateral upper extremity supported;No upper extremity supported;Single extremity supported Standing balance-Leahy Scale: Fair                              ADL Overall ADL's : Needs assistance/impaired Eating/Feeding: Independent;Bed level   Grooming: Wash/dry hands;Wash/dry face;Oral care;Brushing hair;Bed level                                 General ADL Comments: Pt refused actual practice of ADLs during eval secondary to just returning to bed  and being fatigued.  Began education with pt and son re: safety with ADLs and precautions.  Pt wears dresses at home, but plans to don underpants, socks and shoes mod I, and is interested in AE.   Discussed options for bathing - sponge bathing with Rt LE extended, or showering with Rt LE bagged and taped to prevent getting KI wet.   Pt reports she will sponge bathe for at least the first week.  Instructed pt and son how to don/doff KI and how to bathe Rt LE safetly.  Reinforced need for KI at all times except bathing, and that pt needs to wear secure shoes to prevent slipping or falls     Vision     Perception     Praxis  Pertinent Vitals/Pain Pain Assessment: No/denies pain Faces Pain Scale: Hurts a little bit Pain Location: right leg Pain Descriptors / Indicators: Grimacing Pain Intervention(s): Limited activity within patient's tolerance;Monitored during session;Repositioned;Ice applied     Hand Dominance     Extremity/Trunk Assessment Upper Extremity Assessment Upper Extremity Assessment: Overall WFL for tasks assessed   Lower Extremity Assessment Lower Extremity Assessment: Defer to PT evaluation       Communication Communication Communication: Prefers language other than English (son interpretted for pt )   Cognition  Arousal/Alertness: Awake/alert Behavior During Therapy: WFL for tasks assessed/performed Overall Cognitive Status: Within Functional Limits for tasks assessed                     General Comments       Exercises Exercises: Total Joint (reviewed with pt/sons)     Shoulder Instructions      Home Living Family/patient expects to be discharged to:: Private residence Living Arrangements: Children;Other relatives Available Help at Discharge: Family;Available 24 hours/day Type of Home: Apartment Home Access: Stairs to enter Entergy CorporationEntrance Stairs-Number of Steps: flight Entrance Stairs-Rails: Left Home Layout: One level     Bathroom Shower/Tub: Tub/shower unit Shower/tub characteristics: Engineer, building servicesCurtain Bathroom Toilet: Standard     Home Equipment: Medical laboratory scientific officerCane - single point   Additional Comments: Son reports he, other son and daughter in law will be available to provide assist as needed       Prior Functioning/Environment Level of Independence: Independent with assistive device(s)        Comments: uses cane to walk    OT Diagnosis: Generalized weakness;Acute pain   OT Problem List: Decreased strength;Decreased activity tolerance;Decreased safety awareness;Decreased knowledge of use of DME or AE;Decreased knowledge of precautions;Pain;Obesity   OT Treatment/Interventions: Self-care/ADL training;DME and/or AE instruction;Therapeutic activities;Patient/family education    OT Goals(Current goals can be found in the care plan section) Acute Rehab OT Goals Patient Stated Goal: to go home  OT Goal Formulation: With patient/family Time For Goal Achievement: 01/07/15 Potential to Achieve Goals: Good ADL Goals Pt Will Perform Lower Body Bathing: with min assist;with adaptive equipment;sit to/from stand;bed level Pt Will Perform Lower Body Dressing: with min assist;with adaptive equipment;sit to/from stand Pt Will Transfer to Toilet: with min guard assist;ambulating;regular height  toilet;bedside commode;grab bars Pt Will Perform Toileting - Clothing Manipulation and hygiene: with min guard assist;sit to/from stand Additional ADL Goal #1: Pt/family will be independent with donning/doffing KI Additional ADL Goal #2: Pt and family will be independent with precautions   OT Frequency: Min 2X/week   Barriers to D/C:            Co-evaluation              End of Session Equipment Utilized During Treatment: Right knee immobilizer  Activity Tolerance: Patient limited by fatigue Patient left: in bed;with call bell/phone within reach;with family/visitor present   Time: [id number] OT Time Calculation (min): 16 min Charges:  OT General Charges $OT Visit: 1 Procedure G-Codes:    Jeani Hawkingonarpe, Yassir Enis M 12/31/2014, 3:04 PM

## 2014-12-31 NOTE — Anesthesia Postprocedure Evaluation (Signed)
Anesthesia Post Note  Patient: Danielle Ferguson  Procedure(s) Performed: Procedure(s) (LRB): RIGHT MEDIAL PATELLA FEMORAL LIGAMENT RECONSTRUCTION (Right)  Patient location during evaluation: PACU Anesthesia Type: General Level of consciousness: awake and alert and patient cooperative Pain management: pain level controlled Vital Signs Assessment: post-procedure vital signs reviewed and stable Respiratory status: spontaneous breathing and respiratory function stable Cardiovascular status: stable Anesthetic complications: no    Last Vitals:  Filed Vitals:   12/30/14 2101 12/31/14 0648  BP: 156/54 112/50  Pulse: 98 111  Temp: 37.2 C 38 C  Resp: 20 18    Last Pain:  Filed Vitals:   12/31/14 0653  PainSc: Asleep                 Brie Eppard S

## 2014-12-31 NOTE — Progress Notes (Signed)
Physical Therapy Treatment Patient Details Name: Danielle Ferguson MRN: [id number] DOB: October 17, 1950 Today's Date: 12/31/2014    History of Present Illness 64 y.o. female admitted to [facility] on 12/29/14 for unstable R patella in the setting of TKA ~1.5 years ago with post op fall and instability.  Pt s/p R knee retinacular repair and medial patellofemoral ligament repair.  Pt with significant PMHx of SOB, chronic low back pain, HTN, DM2, and bil TKA.    PT Comments    Pt is progressing well with mobility.  She was able to walk into the hallway today and requires min assist for most mobility.  Sons are very involved and educated pt/family on precautions and HEP.  Stairs tomorrow before possible d/c.    Follow Up Recommendations  Home health PT;Supervision for mobility/OOB     Equipment Recommendations  Rolling walker with 5" wheels;3in1 (PT) (RW short and wide)    Recommendations for Other Services   NA     Precautions / Restrictions Precautions Precautions: Fall;Knee Precaution Booklet Issued: Yes (comment) Precaution Comments: NO KNEE FLEXION ROM Required Braces or Orthoses: Knee Immobilizer - Right Knee Immobilizer - Right: On at all times;Other (comment) (except when preforming hygiene) Restrictions Weight Bearing Restrictions: Yes RLE Weight Bearing: Weight bearing as tolerated    Mobility  Bed Mobility Overal bed mobility: Needs Assistance Bed Mobility: Supine to Sit     Supine to sit: Min assist;HOB elevated     General bed mobility comments: Min assist to help progress right leg over EOB.  Pt using bed rails and HOB maximally elevated.  Will need to practice with HOB flat and no rails tomorrow.   Transfers Overall transfer level: Needs assistance Equipment used: Rolling walker (2 wheeled) Transfers: Sit to/from Stand Sit to Stand: Min assist         General transfer comment: Min assist to support trunk during transitions.  Verbal and tactile cues for safe hand  placement.   Ambulation/Gait Ambulation/Gait assistance: Min guard Ambulation Distance (Feet): 85 Feet Assistive device: Rolling walker (2 wheeled) Gait Pattern/deviations: Step-to pattern;Antalgic;Trunk flexed Gait velocity: decreased Gait velocity interpretation: Below normal speed for age/gender General Gait Details: Pt with moderately antalgic gait pattern, flexed posture.  Verbal and tactile cues to stay closer to RW.           Balance Overall balance assessment: Needs assistance Sitting-balance support: Feet supported;No upper extremity supported Sitting balance-Leahy Scale: Good     Standing balance support: Bilateral upper extremity supported;No upper extremity supported;Single extremity supported Standing balance-Leahy Scale: Fair                      Cognition Arousal/Alertness: Awake/alert Behavior During Therapy: WFL for tasks assessed/performed Overall Cognitive Status: Within Functional Limits for tasks assessed                      Exercises Total Joint Exercises Ankle Circles/Pumps: AROM;Both;10 reps Quad Sets: AROM;Right;10 reps Towel Squeeze: AROM;Both;10 reps Hip ABduction/ADduction: AAROM;Right;10 reps Straight Leg Raises: AAROM;Right;10 reps        Pertinent Vitals/Pain Pain Assessment: Faces Faces Pain Scale: Hurts a little bit Pain Location: right leg Pain Descriptors / Indicators: Grimacing Pain Intervention(s): Limited activity within patient's tolerance;Monitored during session;Repositioned;Ice applied           PT Goals (current goals can now be found in the care plan section) Acute Rehab PT Goals Patient Stated Goal: family would like to take mother home Progress towards PT  goals: Progressing toward goals    Frequency  7X/week    PT Plan Current plan remains appropriate       End of Session Equipment Utilized During Treatment: Right knee immobilizer Activity Tolerance: Patient limited by fatigue;Patient  limited by pain Patient left: in chair;with call bell/phone within reach;with family/visitor present     Time: [id number] PT Time Calculation (min) (ACUTE ONLY): 16 min  Charges:  $Gait Training: 8-22 mins                      Montay Vanvoorhis B. Bertrand Vowels, PT, DPT [phone]   12/31/2014, 12:17 PM

## 2014-12-31 NOTE — Progress Notes (Signed)
OT Cancellation Note  Patient Details Name: Danielle FiguresMonifah Tews MRN: [id number] DOB: [birth year]   Cancelled Treatment:    Reason Eval/Treat Not Completed: Other (comment) -- Patient working with other provider. Will check back for OT evaluation at later time.  Aleah Ahlgrim A 12/31/2014, 9:58 AM

## 2015-01-01 LAB — GLUCOSE, CAPILLARY: Glucose-Capillary: 166 mg/dL — ABNORMAL HIGH (ref 65–99)

## 2015-01-01 NOTE — Progress Notes (Signed)
Physical Therapy Treatment Patient Details Name: Danielle Ferguson MRN: [id number] DOB: 08/13/1950 Today's Date: 01/01/2015    History of Present Illness 64 y.o. female admitted to [facility] on 12/29/14 for unstable R patella in the setting of TKA ~1.5 years ago with post op fall and instability.  Pt s/p R knee retinacular repair and medial patellofemoral ligament repair.  Pt with significant PMHx of SOB, chronic low back pain, HTN, DM2, and bil TKA.    PT Comments    Pt is progressing well with her mobility.  She and her sons were able to demonstrate the ability to access their home, although it was quite effortful.  Pt's planned d/c is this PM.  PT will follow acutely until d/c confirmed.    Follow Up Recommendations  Home health PT;Supervision for mobility/OOB     Equipment Recommendations  Rolling walker with 5" wheels;3in1 (PT) (RW- short and wide)    Recommendations for Other Services   NA     Precautions / Restrictions Precautions Precautions: Fall;Knee Precaution Booklet Issued: Yes (comment) Precaution Comments: NO KNEE FLEXION ROM Required Braces or Orthoses: Knee Immobilizer - Right Knee Immobilizer - Right: On at all times;Other (comment) (except when preforming hygiene) Restrictions RLE Weight Bearing: Weight bearing as tolerated    Mobility     Transfers Overall transfer level: Needs assistance Equipment used: Rolling walker (2 wheeled) Transfers: Sit to/from Stand Sit to Stand: Min guard         General transfer comment: Min gaurd assist for safety due to slow, effortful transitions to stand.  Verbal cues for safe hand placement and RW use.  Ambulation/Gait Ambulation/Gait assistance: Min guard Ambulation Distance (Feet): 5 Feet Assistive device: Rolling walker (2 wheeled) Gait Pattern/deviations: Step-to pattern;Antalgic;Trunk flexed     General Gait Details: Just walked to and from the bottom of the stairs from her recliner chair.  Pt needs min guard  assist for safety during gait and cues for RW proximity.   Stairs Stairs: Yes Stairs assistance: Min assist Stair Management: One rail Left;Step to pattern;Forwards Number of Stairs: 10 General stair comments: Min assist to support trunk and be pt's second railing when ascending and descending the stairs.  Max verbal cues for correct LE sequencing especially when going down the stairs.  Son provided assistance on the way down.  Pt had to turn towards her left, holding to the railing on the right and her son on her left to descend the stairs safely.  She was much more apprehensive and had much more difficulty coming down the stairs vs going up.  Practicing just one flight (she has two) was very exertional and I advised her sons to have a chair ready on the landing so she could rest between flights.           Balance Overall balance assessment: Needs assistance Sitting-balance support: Feet supported Sitting balance-Leahy Scale: Good     Standing balance support: Bilateral upper extremity supported Standing balance-Leahy Scale: Fair                      Cognition Arousal/Alertness: Awake/alert Behavior During Therapy: WFL for tasks assessed/performed Overall Cognitive Status: Within Functional Limits for tasks assessed                      Exercises Total Joint Exercises Ankle Circles/Pumps: AROM;Both;10 reps Quad Sets: AROM;Right;10 reps Towel Squeeze: AROM;Both;10 reps Hip ABduction/ADduction: AAROM;Right;10 reps Straight Leg Raises: AAROM;Right;10 reps  Pertinent Vitals/Pain Pain Assessment: Faces Faces Pain Scale: Hurts little more Pain Location: right leg Pain Descriptors / Indicators: Grimacing;Guarding Pain Intervention(s): Limited activity within patient's tolerance;Monitored during session;Repositioned           PT Goals (current goals can now be found in the care plan section) Acute Rehab PT Goals Patient Stated Goal: to go  home Progress towards PT goals: Progressing toward goals    Frequency  7X/week    PT Plan Current plan remains appropriate       End of Session Equipment Utilized During Treatment: Right knee immobilizer Activity Tolerance: Patient limited by fatigue;Patient limited by pain Patient left: in chair;with call bell/phone within reach;with family/visitor present     Time: [id number] PT Time Calculation (min) (ACUTE ONLY): 23 min  Charges:  $Gait Training: 8-22 mins $Therapeutic Exercise: 8-22 mins            Nai Borromeo B. Robbin Escher, PT, DPT [phone]   01/01/2015, 11:54 AM

## 2015-01-01 NOTE — Progress Notes (Signed)
Sons are back discharge orders given to sons with full understanding of orders

## 2015-01-01 NOTE — Progress Notes (Signed)
Patient has been discharge I am waiting for sons to come back from saying their prayers according to the patient young granddaughter in room

## 2015-01-01 NOTE — Progress Notes (Signed)
Patient ID: Crissie FiguresMonifah Hiser, female   DOB: October 21, 1950, 64 y.o.   MRN: [id number]     Subjective:  Patient reports pain as mild.  Patient denies any CP or SOB.  Ready to go home with son  Objective:   VITALS:   Filed Vitals:   12/31/14 0648 12/31/14 1425 12/31/14 2130 01/01/15 0649  BP: 112/50 137/53 117/49 140/60  Pulse: 111 92 91 83  Temp: 100.4 F (38 C) 99.4 F (37.4 C) 99 F (37.2 C) 97.6 F (36.4 C)  TempSrc: Oral Oral Oral Oral  Resp: 18 18 19 19   Height:      Weight:      SpO2: 94% 99% 94% 98%    ABD soft Sensation intact distally Dorsiflexion/Plantar flexion intact Incision: dressing C/D/I and no drainage Dressing removed and wound is clean and dry no sign of infection  Lab Results  Component Value Date   WBC 7.7 12/30/2014   HGB 10.8* 12/30/2014   HCT 34.1* 12/30/2014   MCV 86.5 12/30/2014   PLT 189 12/30/2014   BMET    Component Value Date/Time   NA 136 12/30/2014 0555   K 3.9 12/30/2014 0555   CL 101 12/30/2014 0555   CO2 28 12/30/2014 0555   GLUCOSE 216* 12/30/2014 0555   BUN 15 12/30/2014 0555   CREATININE 0.97 12/30/2014 0555   CREATININE 0.94 11/19/2014 1939   CALCIUM 8.3* 12/30/2014 0555   GFRNONAA >60 12/30/2014 0555   GFRNONAA 65 11/19/2014 1939   GFRAA >60 12/30/2014 0555   GFRAA 75 11/19/2014 1939     Assessment/Plan: 3 Days Post-Op   Principal Problem:   Recurrent dislocation of right patella Active Problems:   Dislocation of right patella   Advance diet Up with therapy Discharge home with home health WBAT Dry dressing PRN Follow up with Dr Dion SaucierLandau in 2 weeks   Haskel KhanDOUGLAS PARRY, BRANDON 01/01/2015, 7:05 AM  Discussed and agree with above.  Teryl LucyJoshua Trebor Galdamez, MD Cell [phone]

## 2015-01-01 NOTE — Discharge Summary (Signed)
Physician Discharge Summary  Patient ID: Danielle Ferguson MRN: [id number] DOB/AGE: 1950-08-03 64 y.o.  Admit date: 12/29/2014 Discharge date: 01/01/2015  Admission Diagnoses:  Recurrent dislocation of right patella, prosthetic  Discharge Diagnoses:  Principal Problem:   Recurrent dislocation of right patella Obesity:  Estimated body mass index is 45.17 kg/(m^2) as calculated from the following:   Height as of this encounter: 5\' 2"  (1.575 m).   Weight as of this encounter: 112.038 kg (247 lb).    Past Medical History  Diagnosis Date  . High cholesterol     takes Simvastatin daily  . Chronic knee pain   . Shortness of breath     with walking  . Headache(784.0)   . Constipation   . PONV (postoperative nausea and vomiting)   . Arthritis     "back; knees" (04/10/2013)  . Chronic radicular pain of lower back     "right side" (04/10/2013)  . Hypertension     takes LIsinopril-HCTZ daily  . HTN (hypertension) 01/10/2013  . Type II diabetes mellitus (HCC)     takes AutoZoneFarxiga,Metformin,and Novolog daily  . Hypothyroidism     takes SYnthroid daily    Surgeries: Procedure(s): RIGHT MEDIAL RETINACULAR REPAIR on 12/29/2014   Consultants (if any):    Discharged Condition: Improved  Hospital Course: Danielle Ferguson is an 64 y.o. female who was admitted 12/29/2014 with a diagnosis of Recurrent dislocation of right patella and went to the operating room on 12/29/2014 and underwent the above named procedures.    She was given perioperative antibiotics:  Anti-infectives    Start     Dose/Rate Route Frequency Ordered Stop   12/29/14 1800  ceFAZolin (ANCEF) IVPB 2 g/50 mL premix     2 g 100 mL/hr over 30 Minutes Intravenous Every 6 hours 12/29/14 1422 12/30/14 0018   12/29/14 1030  vancomycin (VANCOCIN) 1,500 mg in sodium chloride 0.9 % 500 mL IVPB  Status:  Discontinued     1,500 mg 250 mL/hr over 120 Minutes Intravenous To [facility]Stay [facility] 12/28/14 1449 12/29/14 1028   12/29/14 1000  ceFAZolin (ANCEF) IVPB 2 g/50 mL premix     2 g 100 mL/hr over 30 Minutes Intravenous To ShortStay Surgical 12/28/14 1449 12/29/14 1116    .  She was given sequential compression devices, early ambulation, and xarelto for DVT prophylaxis.  She benefited maximally from the hospital stay and there were no complications.  She worked with physical therapy until she was adequately mobile and safe for discharge.  Recent vital signs:  Filed Vitals:   12/31/14 2130 01/01/15 0649  BP: 117/49 140/60  Pulse: 91 83  Temp: 99 F (37.2 C) 97.6 F (36.4 C)  Resp: 19 19    Recent laboratory studies:  Lab Results  Component Value Date   HGB 10.8* 12/30/2014   HGB 12.6 12/29/2014   HGB 14.3 11/19/2014   Lab Results  Component Value Date   WBC 7.7 12/30/2014   PLT 189 12/30/2014   Lab Results  Component Value Date   INR 1.04 04/02/2013   Lab Results  Component Value Date   NA 136 12/30/2014   K 3.9 12/30/2014   CL 101 12/30/2014   CO2 28 12/30/2014   BUN 15 12/30/2014   CREATININE 0.97 12/30/2014   GLUCOSE 216* 12/30/2014    Discharge Medications:     Medication List    STOP taking these medications        naproxen 500 MG tablet  Commonly known as:  NAPROSYN  TAKE these medications        aspirin 81 MG tablet  Take 81 mg by mouth daily.     baclofen 10 MG tablet  Commonly known as:  LIORESAL  Take 1 tablet (10 mg total) by mouth 3 (three) times daily. As needed for muscle spasm     insulin aspart protamine - aspart (70-30) 100 UNIT/ML FlexPen  Commonly known as:  NOVOLOG 70/30 MIX  Takes 40 units with meal in morning, 30 units at night with meal     levothyroxine 100 MCG tablet  Commonly known as:  SYNTHROID, LEVOTHROID  Take 1 tab by mouth daily on Mon, Wed, Fri, and 1/2 tab other days.     lisinopril-hydrochlorothiazide 20-12.5 MG tablet  Commonly known as:  PRINZIDE,ZESTORETIC  Take 1 tablet by mouth daily.     metFORMIN 1000 MG tablet   Commonly known as:  GLUCOPHAGE  Take 1 tablet (1,000 mg total) by mouth 2 (two) times daily with a meal.     montelukast 10 MG tablet  Commonly known as:  SINGULAIR  Take 1 tablet (10 mg total) by mouth at bedtime.     ondansetron 4 MG tablet  Commonly known as:  ZOFRAN  Take 1 tablet (4 mg total) by mouth every 8 (eight) hours as needed for nausea or vomiting.     oxyCODONE-acetaminophen 5-325 MG tablet  Commonly known as:  ROXICET  Take 1-2 tablets by mouth every 6 (six) hours as needed for severe pain.     sennosides-docusate sodium 8.6-50 MG tablet  Commonly known as:  SENOKOT-S  Take 2 tablets by mouth daily.        Diagnostic Studies: No results found.  Disposition: 06-Home-Health Care Svc        Follow-up Information    Follow up with Eulas Post, MD. Schedule an appointment as soon as possible for a visit in 2 weeks.   Specialty:  Orthopedic Surgery   Contact information:   [address] [place] [address] [phone]        Signed: Eulas Post 01/01/2015, 9:30 AM

## 2015-01-04 ENCOUNTER — Telehealth: Payer: Self-pay

## 2015-01-05 NOTE — Telephone Encounter (Signed)
Threasa AlphaJim Hoffman PT/OT  Called with BP reading of 200 over 90.  The son will monitor her readings and call in with the numbers.  She had taken her medication around noon today.   Advised the family to bring her in if they continue to be high.    Son  [phone]

## 2015-01-06 ENCOUNTER — Ambulatory Visit: Payer: PPO

## 2015-01-06 ENCOUNTER — Ambulatory Visit (INDEPENDENT_AMBULATORY_CARE_PROVIDER_SITE_OTHER): Payer: PPO | Admitting: Family Medicine

## 2015-01-06 VITALS — BP 145/84 | HR 97 | Temp 98.2°F | Resp 18 | Ht 60.0 in | Wt 240.0 lb

## 2015-01-06 DIAGNOSIS — I10 Essential (primary) hypertension: Secondary | ICD-10-CM | POA: Diagnosis not present

## 2015-01-06 MED ORDER — INDAPAMIDE 2.5 MG PO TABS: 2.5000 mg | ORAL_TABLET | Freq: Every day | ORAL | Status: DC

## 2015-01-06 NOTE — Progress Notes (Signed)
 @[facility]FCLOGO @ By signing my name below, I, Raven Small, attest that this documentation has been prepared under the direction and in the presence of Elvina SidleKurt Tahja Liao, MD.  Electronically Signed: Andrew Auaven Small, ED Scribe. 01/06/2015. 5:40 PM.  Patient ID: Danielle Ferguson MRN: [id number], DOB: 04-09-1950, 64 y.o. Date of Encounter: 01/06/2015, 5:31 PM  Primary Physician: Elvina SidleLAUENSTEIN,Dailey Buccheri, MD  Chief Complaint:  Chief Complaint  Patient presents with  . Hypertension    HPI: 64 y.o. year old female with history below presents with hypertension. Telephone note from 12/19 states pt's son called stating her BP had been elevated at 200/90. This morning her BP was 190/90. She reports associated HA and dizziness earlier today, but no symptoms at this time. She has been taking Natrilix. She denies CP and SOB.  Her blood sugar readings today 214 this morning and 179 this afternoon.   She had right medial patellar femoral ligament reconstruction 12/13.    Past Medical History  Diagnosis Date  . High cholesterol     takes Simvastatin daily  . Chronic knee pain   . Shortness of breath     with walking  . Headache(784.0)   . Constipation   . PONV (postoperative nausea and vomiting)   . Arthritis     "back; knees" (04/10/2013)  . Chronic radicular pain of lower back     "right side" (04/10/2013)  . Hypertension     takes LIsinopril-HCTZ daily  . HTN (hypertension) 01/10/2013  . Type II diabetes mellitus (HCC)     takes AutoZoneFarxiga,Metformin,and Novolog daily  . Hypothyroidism     takes SYnthroid daily     Home Meds: Prior to Admission medications   Medication Sig Start Date End Date Taking? Authorizing Provider  aspirin 81 MG tablet Take 81 mg by mouth daily.   Yes Historical Provider, MD  baclofen (LIORESAL) 10 MG tablet Take 1 tablet (10 mg total) by mouth 3 (three) times daily. As needed for muscle spasm 12/29/14  Yes Teryl LucyJoshua Landau, MD  levothyroxine (SYNTHROID, LEVOTHROID) 100 MCG tablet Take  1 tab by mouth daily on Mon, Wed, Fri, and 1/2 tab other days. 11/03/14  Yes Elvina SidleKurt Jerica Creegan, MD  levothyroxine (SYNTHROID, LEVOTHROID) 100 MCG tablet TAKE 1 TAB BY MOUTH DAILY ON MON, WED, FRI, AND 1/2 TAB OTHER DAYS. 01/04/15  Yes Elvina SidleKurt Latise Dilley, MD  lisinopril-hydrochlorothiazide (PRINZIDE,ZESTORETIC) 20-12.5 MG tablet TAKE 1 TABLET BY MOUTH DAILY. 01/04/15  Yes Elvina SidleKurt Judi Jaffe, MD  metFORMIN (GLUCOPHAGE) 1000 MG tablet TAKE 1 TABLET (1,000 MG TOTAL) BY MOUTH 2 (TWO) TIMES DAILY WITH A MEAL. 01/04/15  Yes Elvina SidleKurt Herberth Deharo, MD  montelukast (SINGULAIR) 10 MG tablet Take 1 tablet (10 mg total) by mouth at bedtime. 11/19/14  Yes Elvina SidleKurt Klohe Lovering, MD  NOVOLOG MIX 70/30 FLEXPEN (70-30) 100 UNIT/ML FlexPen TAKES 40 UNITS WITH MEAL IN MORNING, 30 UNITS AT NIGHT WITH MEAL 01/04/15  Yes Elvina SidleKurt Nirvana Blanchett, MD  ondansetron (ZOFRAN) 4 MG tablet Take 1 tablet (4 mg total) by mouth every 8 (eight) hours as needed for nausea or vomiting. 12/29/14  Yes Teryl LucyJoshua Landau, MD  oxyCODONE-acetaminophen (ROXICET) 5-325 MG tablet Take 1-2 tablets by mouth every 6 (six) hours as needed for severe pain. 12/29/14  Yes Teryl LucyJoshua Landau, MD  sennosides-docusate sodium (SENOKOT-S) 8.6-50 MG tablet Take 2 tablets by mouth daily. 12/29/14  Yes Teryl LucyJoshua Landau, MD  simvastatin (ZOCOR) 40 MG tablet TAKE 1 TABLET (40 MG TOTAL) BY MOUTH DAILY AT 8 PM. 01/04/15  Yes Elvina SidleKurt Lynze Reddy, MD    Allergies: No Known Allergies  Social History   Social History  . Marital Status: Widowed    Spouse Name: N/A  . Number of Children: N/A  . Years of Education: N/A   Occupational History  . Not on file.   Social History Main Topics  . Smoking status: Never Smoker   . Smokeless tobacco: Never Used  . Alcohol Use: No  . Drug Use: No  . Sexual Activity: No   Other Topics Concern  . Not on file   Social History Narrative     Review of Systems: Constitutional: negative for chills, fever, night sweats, weight changes, or fatigue  HEENT:  negative for vision changes, hearing loss, congestion, rhinorrhea, ST, epistaxis, or sinus pressure Cardiovascular: negative for chest pain or palpitations Respiratory: negative for hemoptysis, wheezing, shortness of breath, or cough Abdominal: negative for abdominal pain, nausea, vomiting, diarrhea, or constipation Dermatological: negative for rash Neurologic: negative for headache, dizziness, or syncope All other systems reviewed and are otherwise negative with the exception to those above and in the HPI.   Physical Exam: Blood pressure 145/84, pulse 97, temperature 98.2 F (36.8 C), temperature source Oral, resp. rate 18, height 5' (1.524 m), weight 240 lb (108.863 kg), SpO2 96 %., Body mass index is 46.87 kg/(m^2). General: Well developed, well nourished, in no acute distress. Head: Normocephalic, atraumatic, eyes without discharge, sclera non-icteric, nares are without discharge. Bilateral auditory canals clear,  Oral cavity moist Neck: Supple. No thyromegaly. Full ROM. No lymphadenopathy. Lungs: Clear bilaterally to auscultation without wheezes, rales, or rhonchi. Breathing is unlabored. Heart: RRR with S1 S2. No murmurs, rubs, or gallops appreciated. 2/6 systolic murmur. Msk:  Strength and tone normal for age. Extremities/Skin: Warm and dry. No clubbing or cyanosis. No edema. No rashes or suspicious lesions.  Right knee in immobilizing splint Neuro: Alert and oriented X 3. Moves all extremities spontaneously. Gait is normal. CNII-XII grossly in tact. Psych:  Responds to questions appropriately with a normal affect.    ASSESSMENT AND PLAN:  64 y.o. year old female with  This chart was scribed in my presence and reviewed by me personally.    ICD-9-CM ICD-10-CM   1. Essential hypertension 401.9 I10 indapamide (LOZOL) 2.5 MG tablet   We are adding this new medicine and patient will continue to monitor   Signed, Elvina Sidle, MD 01/06/2015 5:31 PM

## 2015-01-11 ENCOUNTER — Telehealth: Payer: Self-pay

## 2015-01-11 DIAGNOSIS — Z794 Long term (current) use of insulin: Principal | ICD-10-CM

## 2015-01-11 DIAGNOSIS — E119 Type 2 diabetes mellitus without complications: Secondary | ICD-10-CM

## 2015-01-11 NOTE — Telephone Encounter (Signed)
Threasa AlphaJim hoffman from [facility] called to request more information in regards to the changes in BP medication made at the pt's last visit here on 01/06/15. BP at rest today: 190/110 no distress.  Pt's #:[phone] Jim's #: [phone]

## 2015-01-12 NOTE — Telephone Encounter (Signed)
Left message for pt to call back  °

## 2015-01-24 MED ORDER — DAPAGLIFLOZIN PROPANEDIOL 5 MG PO TABS: 5.0000 mg | ORAL_TABLET | Freq: Every day | ORAL | Status: DC

## 2015-01-24 NOTE — Telephone Encounter (Signed)
Jan 2    163/84 288 Jan 3  160/73    361 Jan 4   147/80  237 Jan 5   134/70  362 Jan 6   144/79  279 Jan 7 161/91  297

## 2015-02-21 ENCOUNTER — Ambulatory Visit (INDEPENDENT_AMBULATORY_CARE_PROVIDER_SITE_OTHER): Payer: PPO | Admitting: Family Medicine

## 2015-02-21 VITALS — BP 142/74 | HR 106 | Temp 97.9°F | Resp 18 | Ht 59.0 in | Wt 230.6 lb

## 2015-02-21 DIAGNOSIS — I10 Essential (primary) hypertension: Secondary | ICD-10-CM

## 2015-02-21 DIAGNOSIS — E785 Hyperlipidemia, unspecified: Secondary | ICD-10-CM | POA: Diagnosis not present

## 2015-02-21 DIAGNOSIS — E119 Type 2 diabetes mellitus without complications: Secondary | ICD-10-CM

## 2015-02-21 DIAGNOSIS — R350 Frequency of micturition: Secondary | ICD-10-CM | POA: Diagnosis not present

## 2015-02-21 DIAGNOSIS — R300 Dysuria: Secondary | ICD-10-CM | POA: Diagnosis not present

## 2015-02-21 DIAGNOSIS — Z794 Long term (current) use of insulin: Secondary | ICD-10-CM

## 2015-02-21 LAB — POCT URINALYSIS DIP (MANUAL ENTRY)
BILIRUBIN UA: NEGATIVE
Bilirubin, UA: NEGATIVE
Nitrite, UA: NEGATIVE
PROTEIN UA: NEGATIVE
Urobilinogen, UA: 0.2
pH, UA: 5

## 2015-02-21 LAB — POC MICROSCOPIC URINALYSIS (UMFC): Mucus: ABSENT

## 2015-02-21 LAB — POCT GLYCOSYLATED HEMOGLOBIN (HGB A1C): Hemoglobin A1C: 7.4

## 2015-02-21 MED ORDER — LISINOPRIL-HYDROCHLOROTHIAZIDE 20-12.5 MG PO TABS: 1.0000 | ORAL_TABLET | Freq: Every day | ORAL | Status: AC

## 2015-02-21 MED ORDER — LEVOTHYROXINE SODIUM 100 MCG PO TABS: ORAL_TABLET | ORAL | Status: AC

## 2015-02-21 MED ORDER — CIPROFLOXACIN HCL 500 MG PO TABS: 500.0000 mg | ORAL_TABLET | Freq: Two times a day (BID) | ORAL | Status: AC

## 2015-02-21 MED ORDER — DAPAGLIFLOZIN PROPANEDIOL 5 MG PO TABS: 5.0000 mg | ORAL_TABLET | Freq: Every day | ORAL | Status: DC

## 2015-02-21 MED ORDER — SIMVASTATIN 40 MG PO TABS: 40.0000 mg | ORAL_TABLET | Freq: Every day | ORAL | Status: AC

## 2015-02-21 MED ORDER — INSULIN ASPART PROT & ASPART (70-30 MIX) 100 UNIT/ML PEN: PEN_INJECTOR | SUBCUTANEOUS | Status: AC

## 2015-02-21 MED ORDER — BACLOFEN 10 MG PO TABS: 10.0000 mg | ORAL_TABLET | Freq: Three times a day (TID) | ORAL | Status: AC

## 2015-02-21 MED ORDER — INDAPAMIDE 2.5 MG PO TABS: 2.5000 mg | ORAL_TABLET | Freq: Every day | ORAL | Status: AC

## 2015-02-21 MED ORDER — SIMVASTATIN 40 MG PO TABS: ORAL_TABLET | ORAL | Status: DC

## 2015-02-21 MED ORDER — METFORMIN HCL 1000 MG PO TABS: 500.0000 mg | ORAL_TABLET | Freq: Two times a day (BID) | ORAL | Status: AC

## 2015-02-21 NOTE — Addendum Note (Signed)
Addended by: Elvina Sidle on: 02/21/2015 03:40 PM   Modules accepted: Kipp Brood

## 2015-02-21 NOTE — Progress Notes (Signed)
By signing my name below, I, Stann Ore, attest that this documentation has been prepared under the direction and in the presence of Elvina Sidle, MD. Electronically Signed: Stann Ore, Scribe. 02/21/2015 , 3:10 PM .  Patient was seen in room 9 .   Patient ID: Danielle Ferguson MRN: [id number], DOB: 1950/04/14, 65 y.o. Date of Encounter: 02/21/2015  Primary Physician: Elvina Sidle, MD  Chief Complaint:  Chief Complaint  Patient presents with   Dysuria    HPI:  Danielle Ferguson is a 65 y.o. female who presents to [facility] complaining of dysuria with urinary frequency that started 2 days ago. She denies hematuria.   BP Her BP is doing well.   Sugar Her sugar is doing well as well. She denies symptomatic lows.   Right knee surgery Her right knee is doing well. She does have some discomfort and uneasiness in the mornings.  She had left knee surgery about a year ago.   Travel She [place] She requested to have some medications refilled for the trip.   Personal Her primary language is Arabic and her son translated for her in the room.   Past Medical History  Diagnosis Date   High cholesterol     takes Simvastatin daily   Chronic knee pain    Shortness of breath     with walking   Headache(784.0)    Constipation    PONV (postoperative nausea and vomiting)    Arthritis     "back; knees" (04/10/2013)   Chronic radicular pain of lower back     "right side" (04/10/2013)   Hypertension     takes LIsinopril-HCTZ daily   HTN (hypertension) 01/10/2013   Type II diabetes mellitus (HCC)     takes Farxiga,Metformin,and Novolog daily   Hypothyroidism     takes SYnthroid daily     Home Meds: Prior to Admission medications   Medication Sig Start Date End Date Taking? Authorizing Provider  aspirin 81 MG tablet Take 81 mg by mouth daily.   Yes Historical Provider, MD  baclofen (LIORESAL) 10 MG  tablet Take 1 tablet (10 mg total) by mouth 3 (three) times daily. As needed for muscle spasm 12/29/14  Yes Teryl Lucy, MD  dapagliflozin propanediol (FARXIGA) 5 MG TABS tablet Take 5 mg by mouth daily. 01/24/15  Yes Elvina Sidle, MD  indapamide (LOZOL) 2.5 MG tablet Take 1 tablet (2.5 mg total) by mouth daily. 01/06/15  Yes Elvina Sidle, MD  levothyroxine (SYNTHROID, LEVOTHROID) 100 MCG tablet TAKE 1 TAB BY MOUTH DAILY ON MON, WED, FRI, AND 1/2 TAB OTHER DAYS. 01/04/15  Yes Elvina Sidle, MD  lisinopril-hydrochlorothiazide (PRINZIDE,ZESTORETIC) 20-12.5 MG tablet TAKE 1 TABLET BY MOUTH DAILY. 01/04/15  Yes Elvina Sidle, MD  metFORMIN (GLUCOPHAGE) 1000 MG tablet TAKE 1 TABLET (1,000 MG TOTAL) BY MOUTH 2 (TWO) TIMES DAILY WITH A MEAL. 01/04/15  Yes Elvina Sidle, MD  NOVOLOG MIX 70/30 FLEXPEN (70-30) 100 UNIT/ML FlexPen TAKES 40 UNITS WITH MEAL IN MORNING, 30 UNITS AT NIGHT WITH MEAL 01/04/15  Yes Elvina Sidle, MD  oxyCODONE-acetaminophen (ROXICET) 5-325 MG tablet Take 1-2 tablets by mouth every 6 (six) hours as needed for severe pain. 12/29/14  Yes Teryl Lucy, MD  sennosides-docusate sodium (SENOKOT-S) 8.6-50 MG tablet Take 2 tablets by mouth daily. 12/29/14  Yes Teryl Lucy, MD  simvastatin (ZOCOR) 40 MG tablet TAKE 1 TABLET (40 MG TOTAL) BY MOUTH DAILY AT 8 PM. 01/04/15  Yes Elvina Sidle, MD  Allergies: No Known Allergies  Social History   Social History   Marital Status: Widowed    Spouse Name: N/A   Number of Children: N/A   Years of Education: N/A   Occupational History   Not on file.   Social History Main Topics   Smoking status: Never Smoker    Smokeless tobacco: Never Used   Alcohol Use: No   Drug Use: No   Sexual Activity: No   Other Topics Concern   Not on file   Social History Narrative     Review of Systems: Constitutional: negative for fever, chills, night sweats, weight changes, or fatigue  HEENT: negative for vision changes,  hearing loss, congestion, rhinorrhea, ST, epistaxis, or sinus pressure Cardiovascular: negative for chest pain or palpitations Respiratory: negative for hemoptysis, wheezing, shortness of breath, or cough Abdominal: negative for abdominal pain, nausea, vomiting, diarrhea, or constipation Dermatological: negative for rash GU: positive for urinary frequency, dysuria Neurologic: negative for headache, dizziness, or syncope All other systems reviewed and are otherwise negative with the exception to those above and in the HPI.  Physical Exam: Blood pressure 142/74, pulse 106, temperature 97.9 F (36.6 C), temperature source Oral, resp. rate 18, height  (1.499 m), weight 230 lb 9.6 oz (104.599 kg), SpO2 97 %., Body mass index is 46.55 kg/(m^2). General: Well developed, well nourished, in no acute distress. Head: Normocephalic, atraumatic, eyes without discharge, sclera non-icteric, nares are without discharge. Bilateral auditory canals clear, TM's are without perforation, pearly grey and translucent with reflective cone of light bilaterally. Oral cavity moist, posterior pharynx without exudate, erythema, peritonsillar abscess, or post nasal drip.  Neck: Supple. No thyromegaly. Full ROM. No lymphadenopathy. Lungs: Clear bilaterally to auscultation without wheezes, rales, or rhonchi. Breathing is unlabored. Heart: RRR with S1 S2. No murmurs, rubs, or gallops appreciated. Msk:  Strength and tone normal for age. Extremities/Skin: Warm and dry. No clubbing or cyanosis. No edema. No rashes or suspicious lesions. Well healed surgical scar over anterior right knee; trace edema Neuro: Alert and oriented X 3. Moves all extremities spontaneously. Gait is normal. CNII-XII grossly in tact. Psych:  Responds to questions appropriately with a normal affect.   Labs:  Results for orders placed or performed in visit on 02/21/15  POCT Microscopic Urinalysis ([facility])  Result Value Ref Range   WBC,UR,HPF,POC  Moderate (A) None WBC/hpf   RBC,UR,HPF,POC Few (A) None RBC/hpf   Bacteria Few (A) None, Too numerous to count   Mucus Absent Absent   Epithelial Cells, UR Per Microscopy Few (A) None, Too numerous to count cells/hpf  POCT urinalysis dipstick  Result Value Ref Range   Color, UA yellow yellow   Clarity, UA cloudy (A) clear   Glucose, UA >=1,000 (A) negative   Bilirubin, UA negative negative   Ketones, POC UA negative negative   Spec Grav, UA <=1.005    Blood, UA moderate (A) negative   pH, UA 5.0    Protein Ur, POC negative negative   Urobilinogen, UA 0.2    Nitrite, UA Negative Negative   Leukocytes, UA small (1+) (A) Negative  POCT glycosylated hemoglobin (Hb A1C)  Result Value Ref Range   Hemoglobin A1C 7.4      ASSESSMENT AND PLAN:  65 y.o. year old female with  This chart was scribed in my presence and reviewed by me personally.    ICD-9-CM ICD-10-CM   1. Dysuria 788.1 R30.0 POCT Microscopic Urinalysis ([facility])     POCT urinalysis dipstick  POCT urinalysis dipstick     POCT Microscopic Urinalysis ([facility])     ciprofloxacin (CIPRO) 500 MG tablet  2. Controlled type 2 diabetes mellitus without complication, without long-term current use of insulin (HCC) 250.00 E11.9 POCT glycosylated hemoglobin (Hb A1C)  3. Frequency of urination 788.41 R35.0 POCT urinalysis dipstick     POCT Microscopic Urinalysis ([facility])     ciprofloxacin (CIPRO) 500 MG tablet  4. Essential hypertension 401.9 I10 indapamide (LOZOL) 2.5 MG tablet  5. Type 2 diabetes mellitus without complication, with long-term current use of insulin (HCC) 250.00 E11.9 dapagliflozin propanediol (FARXIGA) 5 MG TABS tablet   V58.67 Z79.4   6. Hyperlipidemia 272.4 E78.5 simvastatin (ZOCOR) 40 MG tablet   Signed, Elvina Sidle, MD 02/21/2015 3:10 PM

## 2015-02-21 NOTE — Patient Instructions (Signed)
The blood pressure and diabetes are now under good control.  You do have a urinary infection which means used to take antibiotic, one pill twice a day for the next 5 days. This will solve the problem of urinary burning and frequency  I have for new prescriptions for the next 6 months

## 2015-03-09 ENCOUNTER — Ambulatory Visit (INDEPENDENT_AMBULATORY_CARE_PROVIDER_SITE_OTHER): Payer: PPO | Admitting: Family Medicine

## 2015-03-09 VITALS — BP 167/94 | HR 90 | Temp 98.4°F | Resp 18 | Ht 63.39 in | Wt 235.8 lb

## 2015-03-09 DIAGNOSIS — H1132 Conjunctival hemorrhage, left eye: Secondary | ICD-10-CM | POA: Diagnosis not present

## 2015-03-09 DIAGNOSIS — Z794 Long term (current) use of insulin: Secondary | ICD-10-CM

## 2015-03-09 DIAGNOSIS — R140 Abdominal distension (gaseous): Secondary | ICD-10-CM | POA: Diagnosis not present

## 2015-03-09 DIAGNOSIS — K5901 Slow transit constipation: Secondary | ICD-10-CM

## 2015-03-09 DIAGNOSIS — E119 Type 2 diabetes mellitus without complications: Secondary | ICD-10-CM | POA: Diagnosis not present

## 2015-03-09 MED ORDER — OFLOXACIN 0.3 % OP SOLN: 1.0000 [drp] | Freq: Four times a day (QID) | OPHTHALMIC | Status: AC

## 2015-03-09 MED ORDER — DAPAGLIFLOZIN PROPANEDIOL 5 MG PO TABS: 5.0000 mg | ORAL_TABLET | Freq: Every day | ORAL | Status: AC

## 2015-03-09 NOTE — Patient Instructions (Addendum)
I have sent him a prescription for her ferrous enough for 90 days.  Use ofloxacin eyedrops 1 or 2 drop 3 or 4 times daily in the left eye for about 5 days  Take over-the-counter MiraLAX 1 dose daily to try and empty out her bowels better. I think that will help bloating. This can be continued until the bowels are moving easily, then decrease to using it 2 or 3 times a week as needed.  If necessary take a stool softener, Colace, as directed on the bottle.   I encourage you to get more regular exercise and drink lots of water.  If increased abdominal pain or bloating sensation return at anytime go to the emergency room if severe  Continue your other medications.  Constipation, Adult Constipation is when a person has fewer than three bowel movements a week, has difficulty having a bowel movement, or has stools that are dry, hard, or larger than normal. As people grow older, constipation is more common. A low-fiber diet, not taking in enough fluids, and taking certain medicines may make constipation worse.  CAUSES   Certain medicines, such as antidepressants, pain medicine, iron supplements, antacids, and water pills.   Certain diseases, such as diabetes, irritable bowel syndrome (IBS), thyroid disease, or depression.   Not drinking enough water.   Not eating enough fiber-rich foods.   Stress or travel.   Lack of physical activity or exercise.   Ignoring the urge to have a bowel movement.   Using laxatives too much.  SIGNS AND SYMPTOMS   Having fewer than three bowel movements a week.   Straining to have a bowel movement.   Having stools that are hard, dry, or larger than normal.   Feeling full or bloated.   Pain in the lower abdomen.   Not feeling relief after having a bowel movement.  DIAGNOSIS  Your health care provider will take a medical history and perform a physical exam. Further testing may be done for severe constipation. Some tests may include:  A  barium enema X-ray to examine your rectum, colon, and, sometimes, your small intestine.   A sigmoidoscopy to examine your lower colon.   A colonoscopy to examine your entire colon. TREATMENT  Treatment will depend on the severity of your constipation and what is causing it. Some dietary treatments include drinking more fluids and eating more fiber-rich foods. Lifestyle treatments may include regular exercise. If these diet and lifestyle recommendations do not help, your health care provider may recommend taking over-the-counter laxative medicines to help you have bowel movements. Prescription medicines may be prescribed if over-the-counter medicines do not work.  HOME CARE INSTRUCTIONS   Eat foods that have a lot of fiber, such as fruits, vegetables, whole grains, and beans.  Limit foods high in fat and processed sugars, such as french fries, hamburgers, cookies, candies, and soda.   A fiber supplement may be added to your diet if you cannot get enough fiber from foods.   Drink enough fluids to keep your urine clear or pale yellow.   Exercise regularly or as directed by your health care provider.   Go to the restroom when you have the urge to go. Do not hold it.   Only take over-the-counter or prescription medicines as directed by your health care provider. Do not take other medicines for constipation without talking to your health care provider first.  SEEK IMMEDIATE MEDICAL CARE IF:   You have bright red blood in your stool.   Your  constipation lasts for more than 4 days or gets worse.   You have abdominal or rectal pain.   You have thin, pencil-like stools.   You have unexplained weight loss. MAKE SURE YOU:   Understand these instructions.  Will watch your condition.  Will get help right away if you are not doing well or get worse.   This information is not intended to replace advice given to you by your health care provider. Make sure you discuss any questions  you have with your health care provider.   Document Released: 10/01/2003 Document Revised: 01/23/2014 Document Reviewed: 10/14/2012 Elsevier Interactive Patient Education 2016 [employer]     Subconjunctival Hemorrhage Subconjunctival hemorrhage is bleeding that happens between the white part of your eye (sclera) and the clear membrane that covers the outside of your eye (conjunctiva). There are many tiny blood vessels near the surface of your eye. A subconjunctival hemorrhage happens when one or more of these vessels breaks and bleeds, causing a red patch to appear on your eye. This is similar to a bruise. Depending on the amount of bleeding, the red patch may only cover a small area of your eye or it may cover the entire visible part of the sclera. If a lot of blood collects under the conjunctiva, there may also be swelling. Subconjunctival hemorrhages do not affect your vision or cause pain, but your eye may feel irritated if there is swelling. Subconjunctival hemorrhages usually do not require treatment, and they disappear on their own within two weeks. CAUSES This condition may be caused by:  Mild trauma, such as rubbing your eye too hard.  Severe trauma or blunt injuries.  Coughing, sneezing, or vomiting.  Straining, such as when lifting a heavy object.  High blood pressure.  Recent eye surgery.  A history of diabetes.  Certain medicines, especially blood thinners (anticoagulants).  Other conditions, such as eye tumors, bleeding disorders, or blood vessel abnormalities. Subconjunctival hemorrhages can happen without an obvious cause.  SYMPTOMS  Symptoms of this condition include:  A bright red or dark red patch on the white part of the eye.  The red area may spread out to cover a larger area of the eye before it goes away.  The red area may turn brownish-yellow before it goes away.  Swelling.  Mild eye irritation. DIAGNOSIS This condition is diagnosed with a  physical exam. If your subconjunctival hemorrhage was caused by trauma, your health care provider may refer you to an eye specialist (ophthalmologist) or another specialist to check for other injuries. You may have other tests, including:  An eye exam.  A blood pressure check.  Blood tests to check for bleeding disorders. If your subconjunctival hemorrhage was caused by trauma, X-rays or a CT scan may be done to check for other injuries. TREATMENT Usually, no treatment is needed. Your health care provider may recommend eye drops or cold compresses to help with discomfort. HOME CARE INSTRUCTIONS  Take over-the-counter and prescription medicines only as directed by your health care provider.  Use eye drops or cold compresses to help with discomfort as directed by your health care provider.  Avoid activities, things, and environments that may irritate or injure your eye.  Keep all follow-up visits as told by your health care provider. This is important. SEEK MEDICAL CARE IF:  You have pain in your eye.  The bleeding does not go away within 3 weeks.  You keep getting new subconjunctival hemorrhages. SEEK IMMEDIATE MEDICAL CARE IF:  Your  vision changes or you have difficulty seeing.  You suddenly develop severe sensitivity to light.  You develop a severe headache, persistent vomiting, confusion, or abnormal tiredness (lethargy).  Your eye seems to bulge or protrude from your eye socket.  You develop unexplained bruises on your body.  You have unexplained bleeding in another area of your body.   This information is not intended to replace advice given to you by your health care provider. Make sure you discuss any questions you have with your health care provider.   Document Released: 01/02/2005 Document Revised: 09/23/2014 Document Reviewed: 03/11/2014 Elsevier Interactive Patient Education [employer]

## 2015-03-09 NOTE — Progress Notes (Signed)
Patient ID: Danielle Ferguson, female    DOB: 17-Jul-1950  Age: 65 y.o. MRN: [id number]  Chief Complaint  Patient presents with  . Conjunctivitis  . Bloated    Subjective:   Patient is here for several things. Yesterday morning she awakened and noticed her left eye was red. She knows of no specific trauma to it. Has not had this before.  She also has been having problems with bloating in her abdomen, especially aware of it at night when she lays down. She has been having a lot of gas. She does have a lot of constipation. No major pain. She is on a number of medications as per her list  Patient is diabetic and diabetes has been satisfactorily controlled. Is followed by Dr. Milus Glazier at this practice.  Current allergies, medications, problem list, past/family and social histories reviewed.  Objective:  BP 167/94 mmHg  Pulse 90  Temp(Src) 98.4 F (36.9 C) (Oral)  Resp 18  Ht 5' 3.39" (1.61 m)  Wt 235 lb 12.8 oz (106.958 kg)  BMI 41.26 kg/m2  SpO2 98%  No major acute distress. Alert and oriented. Prominent left some conjunctival hemorrhage superiorly and left lateral portions of the eye. Fundi appear benign. No conjunctival here or injury could be noted. Without significant nodes. Her abdomen is full. Soft. No masses or tenderness. Morbid obesity is noted.  Assessment & Plan:   Assessment: 1. Type 2 diabetes mellitus without complication, with long-term current use of insulin (HCC)   2. Subconjunctival hematoma, left   3. Abdominal bloating   4. Slow transit constipation       Plan: Will change her Farxiga to 90 day prescription per her preference. Ofloxacin eyedrops Laxatives    No orders of the defined types were placed in this encounter.    No orders of the defined types were placed in this encounter.         Patient Instructions  I have sent him a prescription for her ferrous enough for 90 days.  Use ofloxacin eyedrops 1 or 2 drop 3 or 4 times daily in  the left eye for about 5 days  Take over-the-counter MiraLAX 1 dose daily to try and empty out her bowels better. I think that will help bloating. This can be continued until the bowels are moving easily, then decrease to using it 2 or 3 times a week as needed.  If necessary take a stool softener, Colace, as directed on the bottle.   I encourage you to get more regular exercise and drink lots of water.  If increased abdominal pain or bloating sensation return at anytime go to the emergency room if severe  Continue your other medications.  Constipation, Adult Constipation is when a person has fewer than three bowel movements a week, has difficulty having a bowel movement, or has stools that are dry, hard, or larger than normal. As people grow older, constipation is more common. A low-fiber diet, not taking in enough fluids, and taking certain medicines may make constipation worse.  CAUSES   Certain medicines, such as antidepressants, pain medicine, iron supplements, antacids, and water pills.   Certain diseases, such as diabetes, irritable bowel syndrome (IBS), thyroid disease, or depression.   Not drinking enough water.   Not eating enough fiber-rich foods.   Stress or travel.   Lack of physical activity or exercise.   Ignoring the urge to have a bowel movement.   Using laxatives too much.  SIGNS AND SYMPTOMS   Having fewer  than three bowel movements a week.   Straining to have a bowel movement.   Having stools that are hard, dry, or larger than normal.   Feeling full or bloated.   Pain in the lower abdomen.   Not feeling relief after having a bowel movement.  DIAGNOSIS  Your health care provider will take a medical history and perform a physical exam. Further testing may be done for severe constipation. Some tests may include:  A barium enema X-ray to examine your rectum, colon, and, sometimes, your small intestine.   A sigmoidoscopy to examine your  lower colon.   A colonoscopy to examine your entire colon. TREATMENT  Treatment will depend on the severity of your constipation and what is causing it. Some dietary treatments include drinking more fluids and eating more fiber-rich foods. Lifestyle treatments may include regular exercise. If these diet and lifestyle recommendations do not help, your health care provider may recommend taking over-the-counter laxative medicines to help you have bowel movements. Prescription medicines may be prescribed if over-the-counter medicines do not work.  HOME CARE INSTRUCTIONS   Eat foods that have a lot of fiber, such as fruits, vegetables, whole grains, and beans.  Limit foods high in fat and processed sugars, such as french fries, hamburgers, cookies, candies, and soda.   A fiber supplement may be added to your diet if you cannot get enough fiber from foods.   Drink enough fluids to keep your urine clear or pale yellow.   Exercise regularly or as directed by your health care provider.   Go to the restroom when you have the urge to go. Do not hold it.   Only take over-the-counter or prescription medicines as directed by your health care provider. Do not take other medicines for constipation without talking to your health care provider first.  SEEK IMMEDIATE MEDICAL CARE IF:   You have bright red blood in your stool.   Your constipation lasts for more than 4 days or gets worse.   You have abdominal or rectal pain.   You have thin, pencil-like stools.   You have unexplained weight loss. MAKE SURE YOU:   Understand these instructions.  Will watch your condition.  Will get help right away if you are not doing well or get worse.   This information is not intended to replace advice given to you by your health care provider. Make sure you discuss any questions you have with your health care provider.   Document Released: 10/01/2003 Document Revised: 01/23/2014 Document Reviewed:  10/14/2012 Elsevier Interactive Patient Education [employer]          Return if symptoms worsen or fail to improve.   HOPPER,DAVID, MD 03/09/2015

## 2016-05-04 IMAGING — CR DG CHEST 2V
4 series · 4 of 4 positions shown · non-contrast
Comparison: None.

CLINICAL DATA: Shortness of Breath

EXAM:
CHEST  2 VIEW

[PA (1 of 2)]
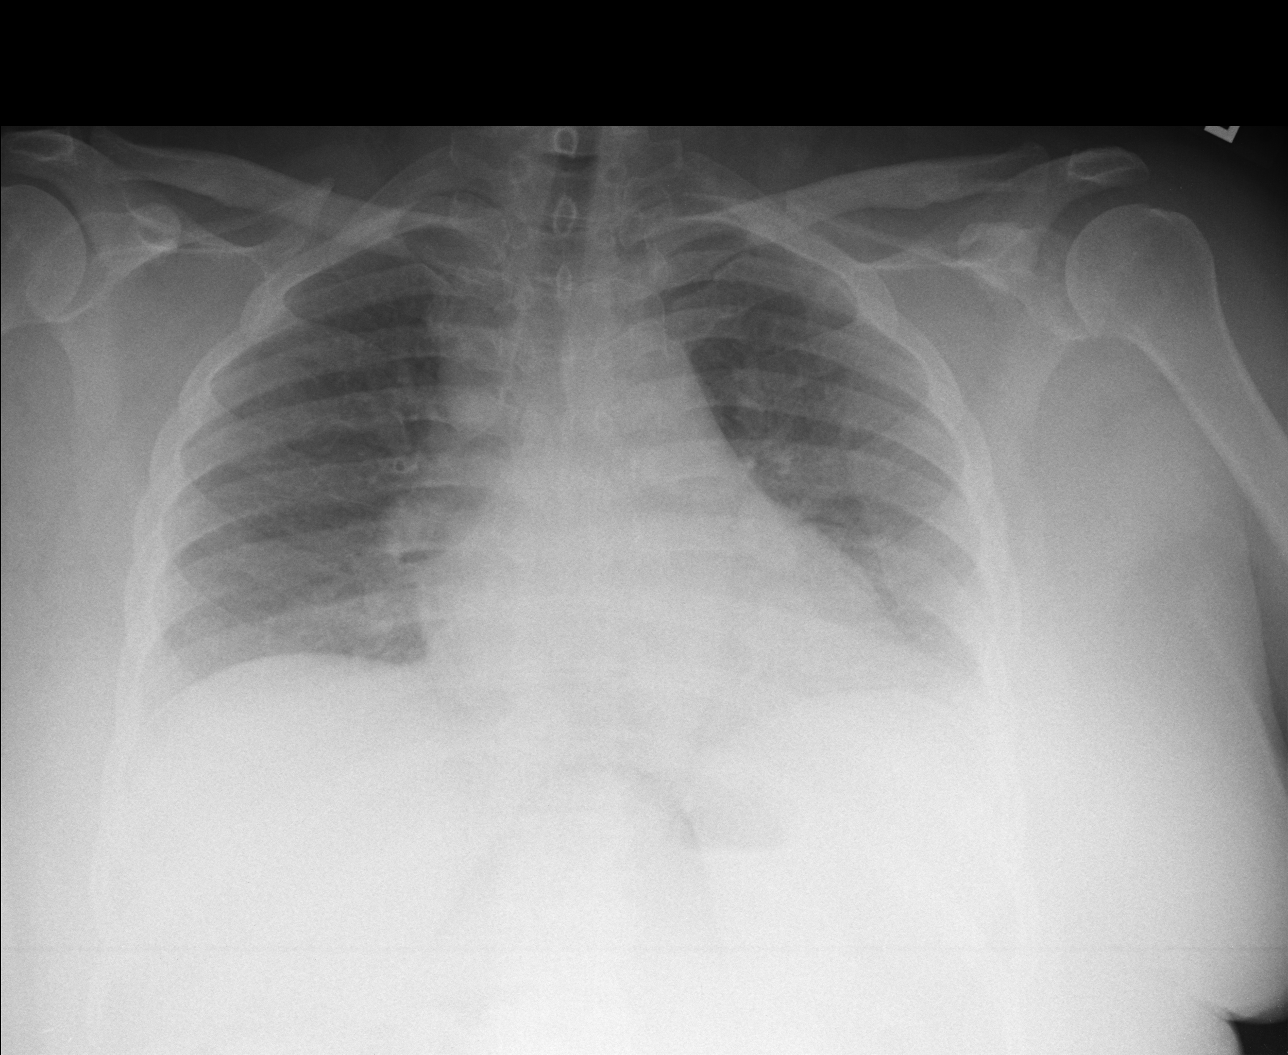

[lateral (1 of 2)]
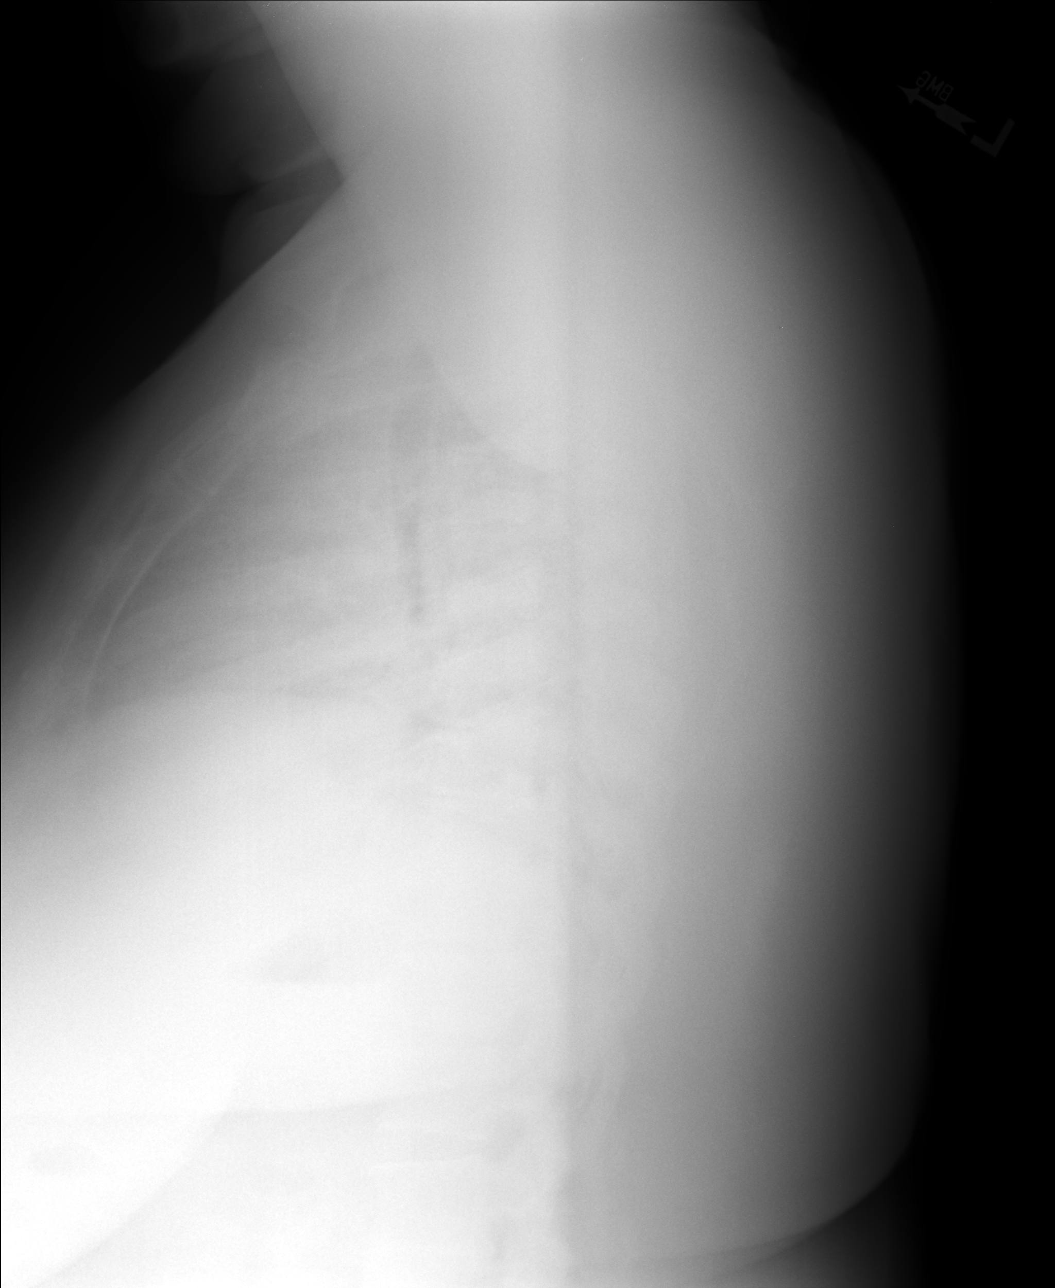

[PA (2 of 2)]
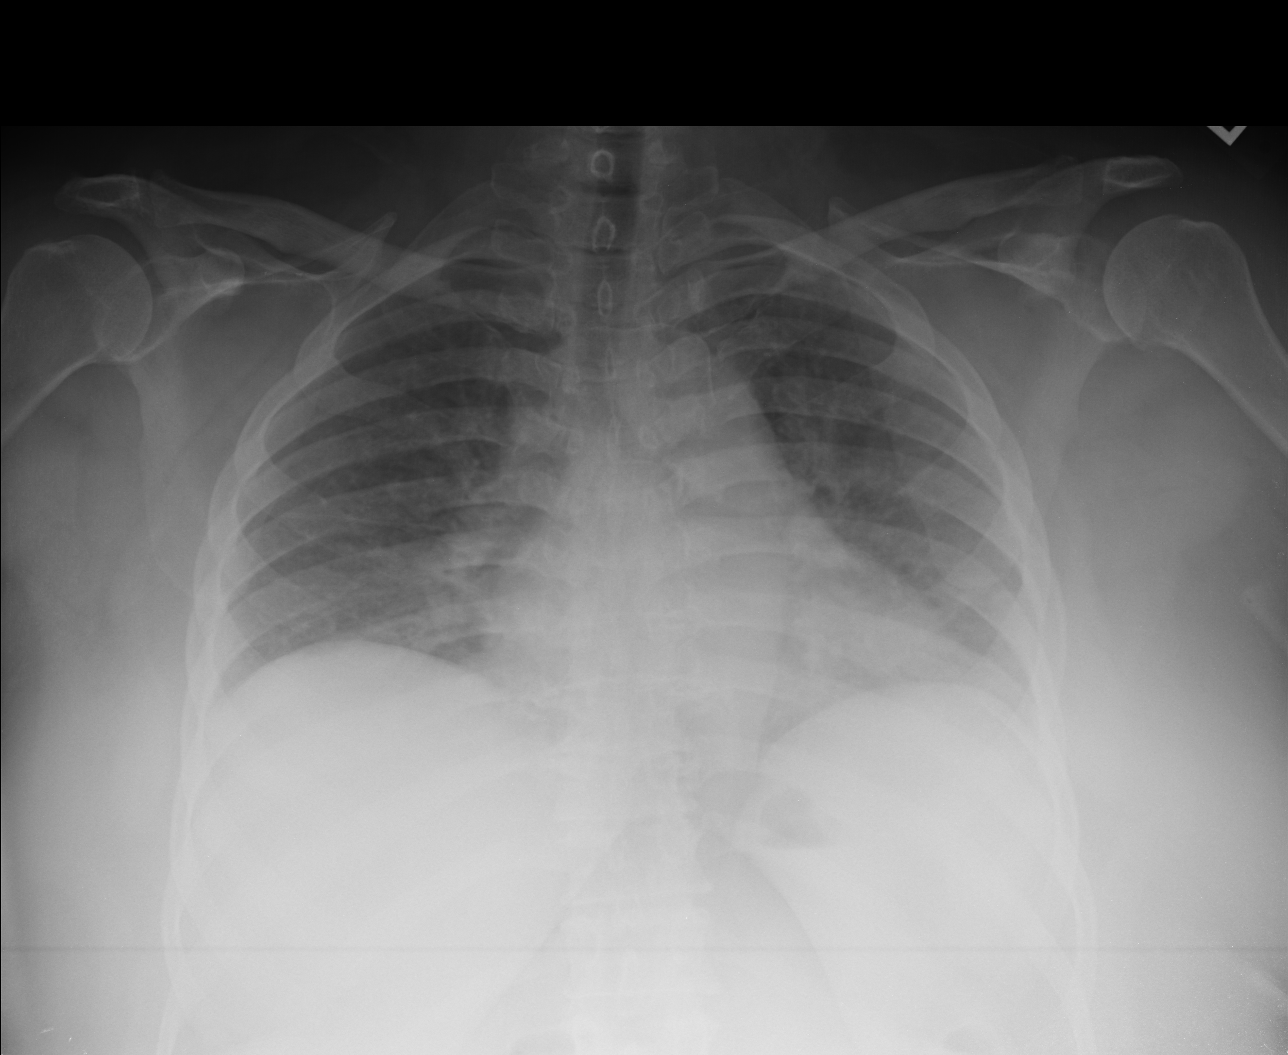

[lateral (2 of 2)]
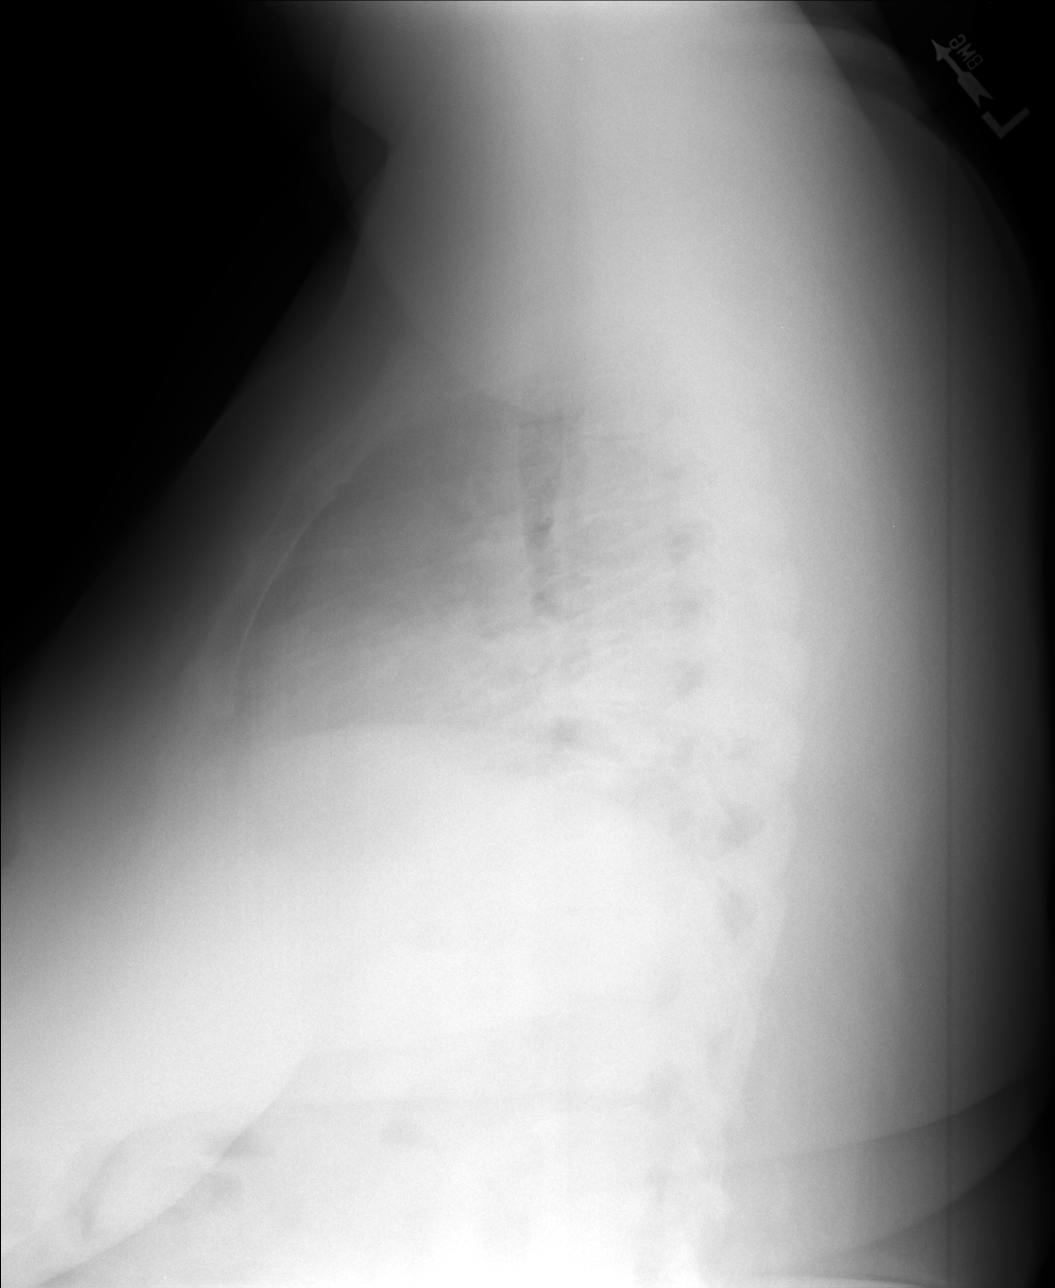

[4 of 4 positions shown; findings below may reference images not displayed]

FINDINGS: Cardiomediastinal silhouette is unremarkable. The study is limited
by patient's large body habitus. No acute infiltrate or pleural
effusion. No pulmonary edema.
IMPRESSION: No active cardiopulmonary disease.

## 2016-05-23 IMAGING — CR DG CHEST 2V
2 series · 2 of 2 positions shown · non-contrast
Comparison: 10/31/2014

CLINICAL DATA: Cough.  Medical clearance for right knee surgery.

EXAM:
CHEST  2 VIEW

[PA]
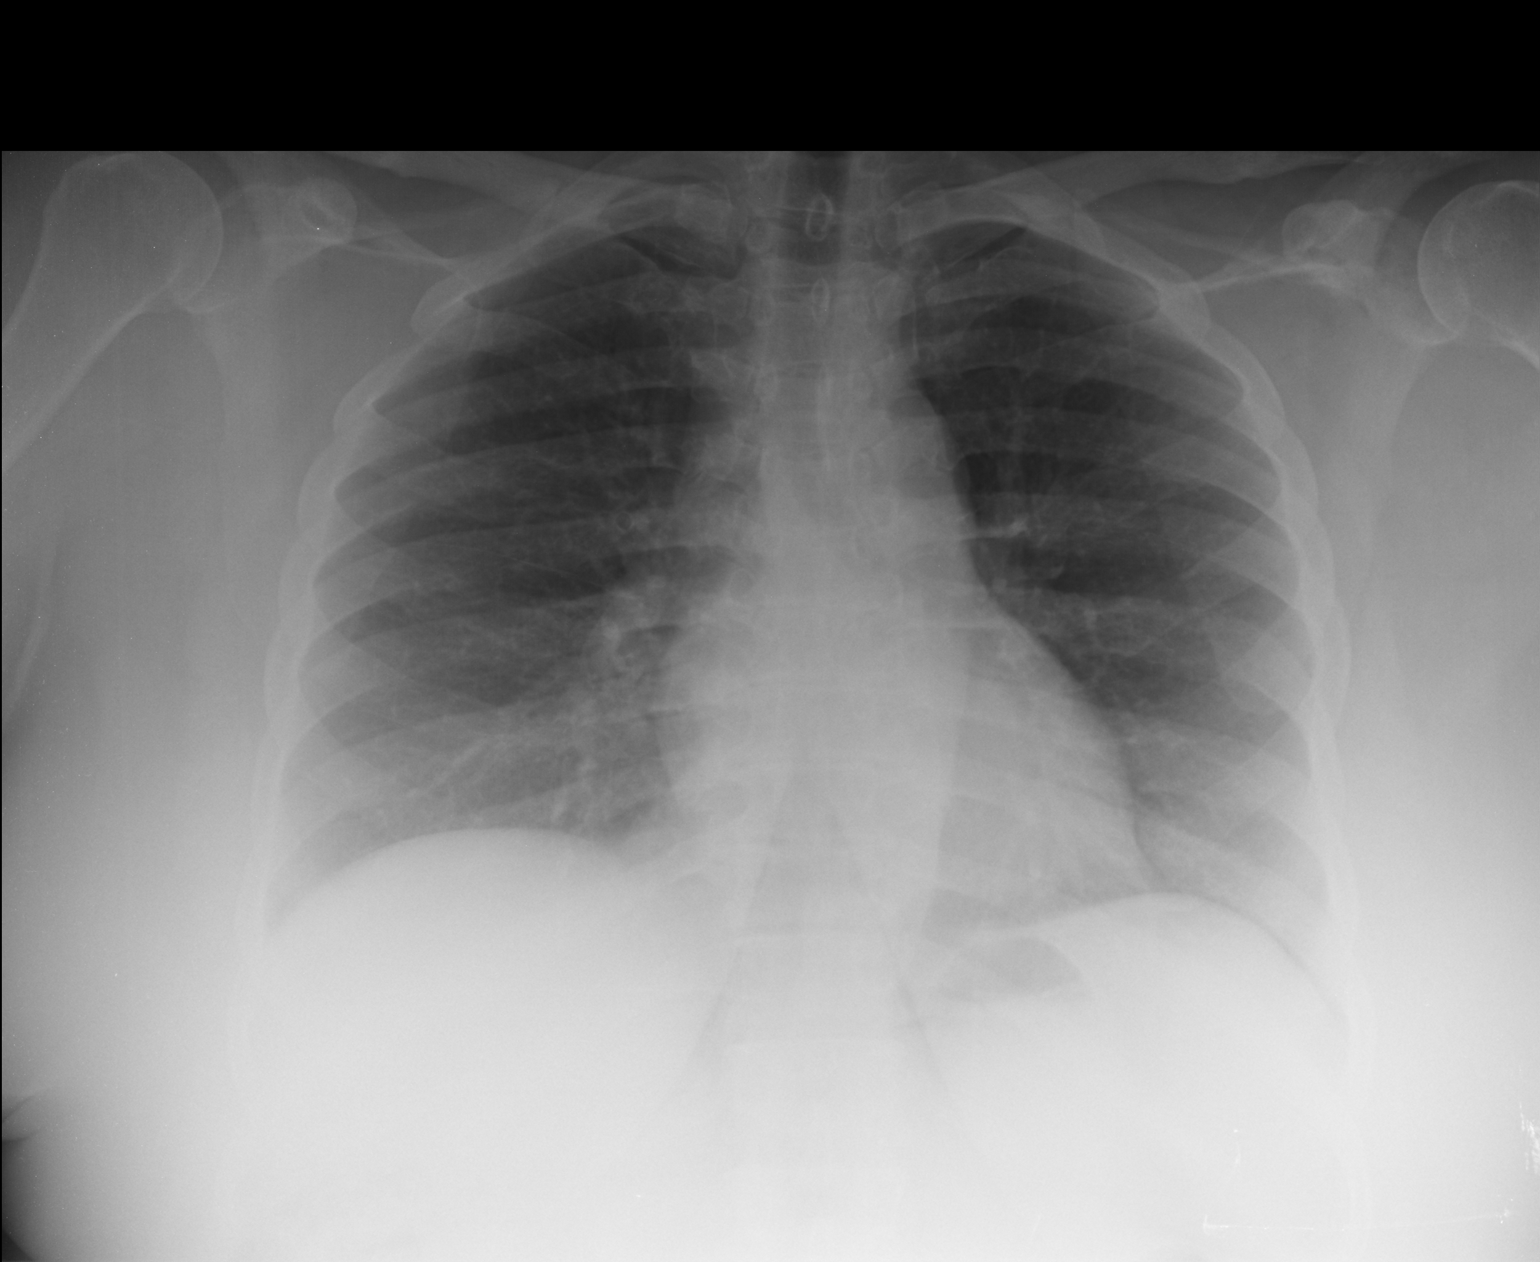

[lateral]
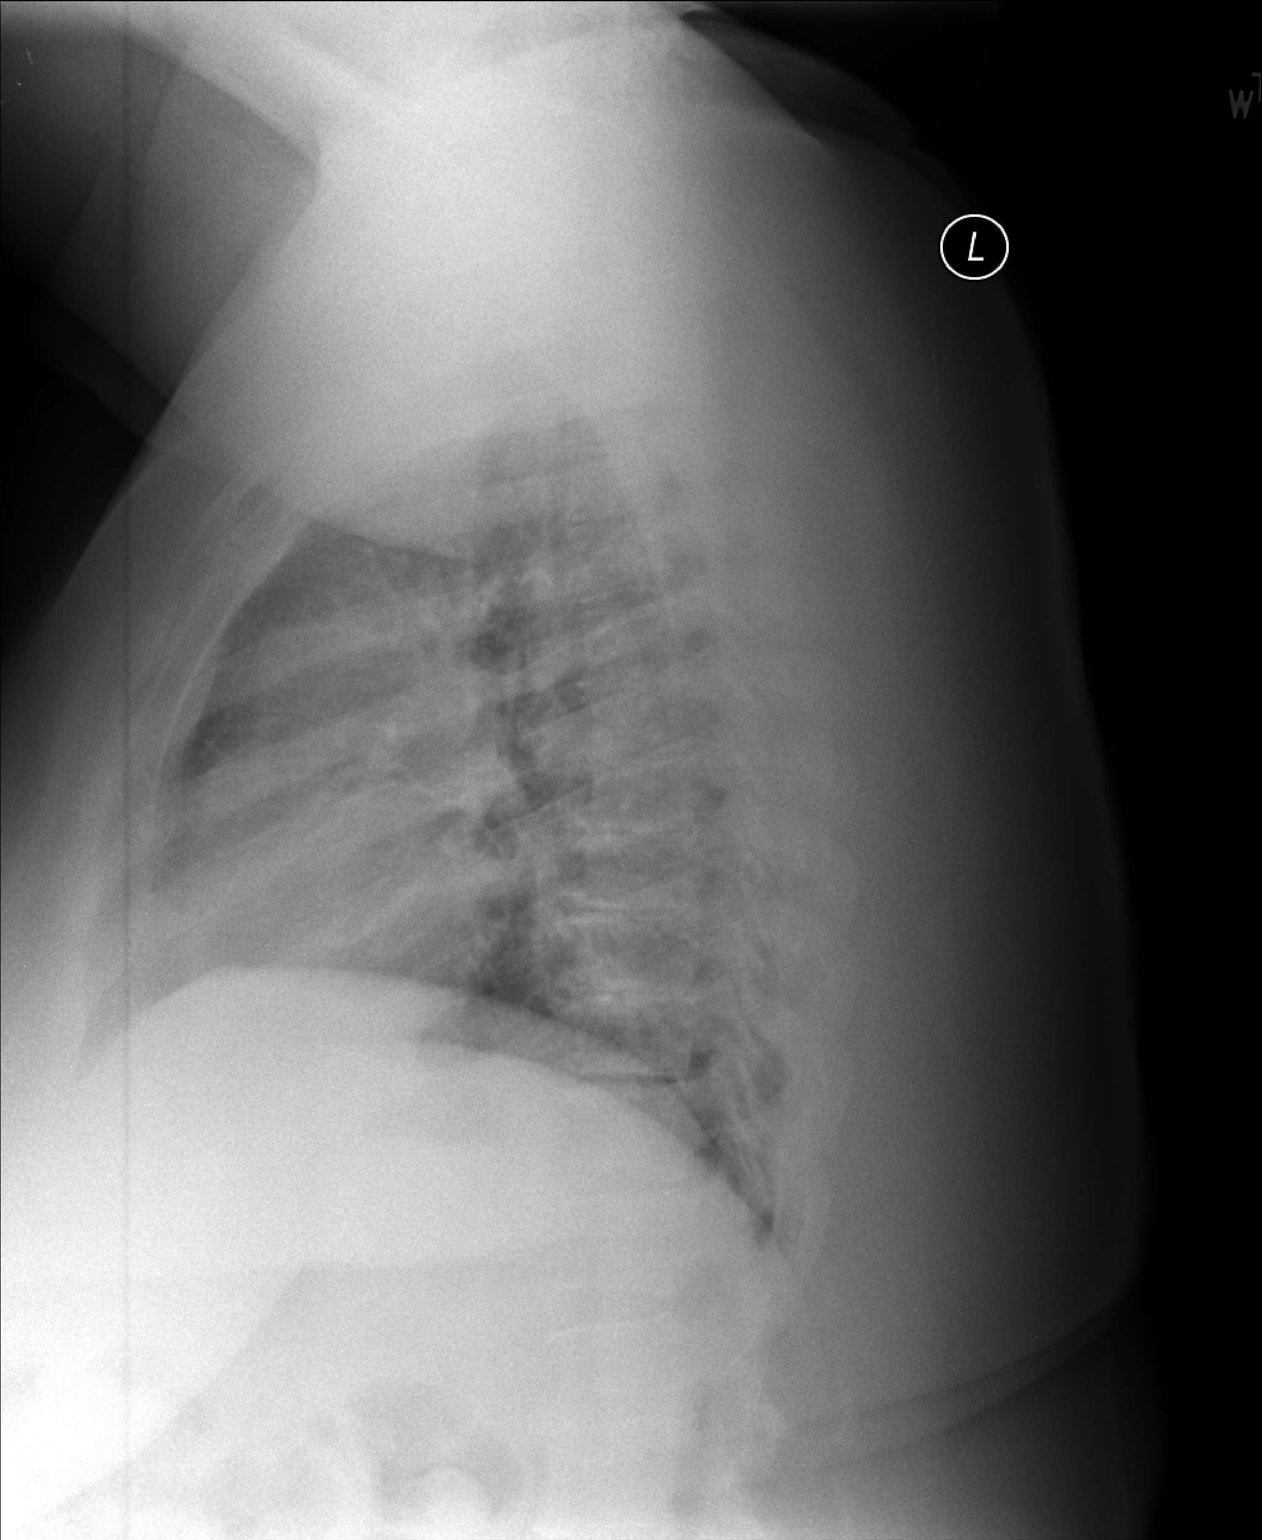

[2 of 2 positions shown; findings below may reference images not displayed]

FINDINGS: The heart size and mediastinal contours are within normal limits.
Both lungs are clear. The visualized skeletal structures are
unremarkable.
IMPRESSION: No active cardiopulmonary disease.
